# Patient Record
Sex: Female | Born: 1990 | Race: Black or African American | Hispanic: No | Marital: Married | Smoking: Former smoker
Health system: Southern US, Community
[De-identification: ages and names within clinical notes are randomized; demographics above are authoritative.]

## PROBLEM LIST (undated history)

## (undated) HISTORY — PX: TONSILLECTOMY: SUR1361

---

## 2011-04-07 ENCOUNTER — Emergency Department (HOSPITAL_COMMUNITY)
Admission: EM | Admit: 2011-04-07 | Discharge: 2011-04-07 | Disposition: A | Discharge status: Home / Self Care | Payer: BC Managed Care – HMO | Attending: Emergency Medicine | Admitting: Emergency Medicine

## 2011-04-07 ENCOUNTER — Encounter (HOSPITAL_COMMUNITY): Payer: Self-pay | Admitting: *Deleted

## 2011-04-07 DIAGNOSIS — R509 Fever, unspecified: Secondary | ICD-10-CM | POA: Insufficient documentation

## 2011-04-07 DIAGNOSIS — R6889 Other general symptoms and signs: Secondary | ICD-10-CM | POA: Insufficient documentation

## 2011-04-07 DIAGNOSIS — J3489 Other specified disorders of nose and nasal sinuses: Secondary | ICD-10-CM | POA: Insufficient documentation

## 2011-04-07 DIAGNOSIS — IMO0001 Reserved for inherently not codable concepts without codable children: Secondary | ICD-10-CM | POA: Insufficient documentation

## 2011-04-07 DIAGNOSIS — R05 Cough: Secondary | ICD-10-CM | POA: Insufficient documentation

## 2011-04-07 DIAGNOSIS — R599 Enlarged lymph nodes, unspecified: Secondary | ICD-10-CM | POA: Insufficient documentation

## 2011-04-07 DIAGNOSIS — R059 Cough, unspecified: Secondary | ICD-10-CM | POA: Insufficient documentation

## 2011-04-07 DIAGNOSIS — R070 Pain in throat: Secondary | ICD-10-CM | POA: Insufficient documentation

## 2011-04-07 DIAGNOSIS — J45909 Unspecified asthma, uncomplicated: Secondary | ICD-10-CM | POA: Insufficient documentation

## 2011-04-07 MED ORDER — ACETAMINOPHEN 325 MG PO TABS
650.0000 mg | ORAL_TABLET | Freq: Once | ORAL | Status: AC
  Administered 2011-04-07: 650 mg via ORAL
  Filled 2011-04-07: qty 2

## 2011-04-07 MED ORDER — OSELTAMIVIR PHOSPHATE 75 MG PO CAPS: 75.0000 mg | ORAL_CAPSULE | Freq: Two times a day (BID) | ORAL | Status: AC

## 2011-04-07 MED ORDER — DEXAMETHASONE SODIUM PHOSPHATE 10 MG/ML IJ SOLN
20.0000 mg | Freq: Once | INTRAMUSCULAR | Status: AC
  Administered 2011-04-07: 10 mg via INTRAMUSCULAR
  Filled 2011-04-07: qty 1

## 2011-04-07 MED ORDER — HYDROCODONE-HOMATROPINE 5-1.5 MG/5ML PO SYRP: 5.0000 mL | ORAL_SOLUTION | Freq: Four times a day (QID) | ORAL | Status: AC | PRN

## 2011-04-07 NOTE — ED Provider Notes (Signed)
Medical screening examination/treatment/procedure(s) were performed by non-physician practitioner and as supervising physician I was immediately available for consultation/collaboration.   Loren Racer, MD 04/07/11 [phone]

## 2011-04-07 NOTE — Discharge Instructions (Signed)
Take medications as prescribed. Followup with your doctor in regards to your hospital visit. If you do not have a doctor use the resource guide listed below to help he find one. You may return to the emergency department if symptoms worsen, become progressive, or become more concerning. Read below to learn more about your diagnosis & reasons to return. Use Tylenol to treat your fever  ° °Influenza, Adult  °Influenza (flu)  starts suddenly, usually with a fever. It causes chills, dry and hacking cough, headache, body aches, and sore throat. Influenza spreads easily from one person to another.  °HOME CARE  °Only take medicines as told by your doctor.  °Rest.  °Drink enough fluids to keep your pee (urine) clear or pale yellow.  °Wash your hands often. Do this after you blow your nose, after you go to the bathroom, and before you touch food.  °GET HELP RIGHT AWAY IF:  °You have shortness of breath while resting.  °You have pain or pressure in the chest or belly (abdomen).  °You suddenly feel dizzy.  °You feel confused.  °You have a hard time breathing.  °Your skin or nails turn bluish in color.  °You get a bad neck pain or stiffness.  °You get a bad headache, face pain, or earache.  °You throw up (vomit) a lot and often.  °You have a fever > 101 that persists  °MAKE SURE YOU:  °Understand these instructions.  °Will watch your condition.  °Will get help right away if you are not doing well or get worse.  ° ° °RESOURCE GUIDE ° °Dental Problems ° °Patients with Medicaid: °[facility]                     [facility] °[address]                                           [address] °Phone:  [phone]                                                  Phone:  510-2600 ° °If unable to pay or uninsured, contact:  [facility] or [facility] to become qualified for the adult dental clinic. ° °Chronic Pain Problems °Contact [facility]  [phone] °Patients  need to be referred by their primary care doctor. ° °Insufficient Money for Medicine °Contact [employer]:  call "211" or [facility] 271-5999. ° °No Primary Care Doctor °Call [facility]  832-8000 °Other agencies that provide inexpensive medical care °   [facility]  832-8035 °   [facility]  832-7272 °   [facility]  271-5999 °   [facility]  832-4777 °   [facility]  373-0678 °   [facility]  272-1050 ° °Psychological Services °[facility]  [phone] °[facility]  [phone] °[facility]   800 853-5163 (emergency services 641-4993) ° °Substance Abuse Resources °[facility]  336-882-2125 °[facility] 336-784-9470 °[facility] 336-285-9073 °[facility] 336-845-3988 °Residential & Outpatient Substance Abuse Program  800-659-3381 ° °Abuse/Neglect °[place] [facility] (336) 641-3795 °[place]   Child Abuse Hotline 800-378-5315 (After Hours) ° °Emergency Shelter °[facility] (336) 271-5985 ° °Maternity Homes °[facility] (336) 275-9566 °[facility] (704) 372-4663 ° °MRSA Hotline #:   832-7006 ° ° ° °[place] [facility] ° °[facility] [place] [facility]     [employer]                          [facility] °[address] [place]                       [address]      [address]  °[place]                                                [place]                            [place] °Phone:  349-3220                                   Phone:  [phone]                 Phone:  342-8140 ° °[facility] °Phone:  342-8316 ° °[place] Child Abuse Hotline °(336) 342-1394 °(336) 342-3537 (After Hours) ° ° °

## 2011-04-07 NOTE — ED Notes (Signed)
Pt only was given 10mg  of decadron per edpa verbal order

## 2011-04-07 NOTE — ED Notes (Signed)
Pt states she started to have a sore throat yesterday after work. Pt states she is having generalized body aches with ear ringing. Pt denies any dizziness or chest tightness. Pt states she has felt warm but is unsure if she had a temp.

## 2011-04-07 NOTE — ED Provider Notes (Signed)
History     CSN: [id number]  Arrival date & time 04/07/11  4098   First MD Initiated Contact with Patient 04/07/11 [phone]      Chief Complaint  Patient presents with  . Sore Throat  . Generalized Body Aches    (Consider location/radiation/quality/duration/timing/severity/associated sxs/prior treatment) HPI Comments: Pt presents to the ED with complaints of flu-like symptoms of cough, congestion, sore throat, muscle aches, chills, fevers. The patient states that the symptoms started yesterday.  Pt has not been around other sick contacts. Pt did not get the flu shot this year. The patient denies headaches, neck pain, weakness, vision changes, severe abdominal pain, inability to eat or drink, difficulty breathing, SOB, wheezing, chest pain. The patient has tried cough medicine, NSAIDS, and rest but has only felt mild relief.    Patient is a 21 y.o. female presenting with pharyngitis. The history is provided by the patient.  Sore Throat This is a new problem. The current episode started yesterday. The problem occurs constantly. The problem has been gradually worsening. Associated symptoms include chills, congestion, fatigue, a fever and myalgias. Pertinent negatives include no abdominal pain, anorexia, arthralgias, change in bowel habit, chest pain, coughing, diaphoresis, headaches, nausea, neck pain, numbness, rash, sore throat, swollen glands, urinary symptoms, vertigo, visual change, vomiting or weakness. The symptoms are aggravated by drinking, eating and swallowing. She has tried nothing for the symptoms.    Past Medical History  Diagnosis Date  . Asthma     Past Surgical History  Procedure Date  . Tonsillectomy     History reviewed. No pertinent family history.  History  Substance Use Topics  . Smoking status: Never Smoker   . Smokeless tobacco: Not on file  . Alcohol Use: Yes     occa    OB History    Grav Para Term Preterm Abortions TAB SAB Ect Mult Living        Review of Systems  Constitutional: Positive for fever, chills and fatigue. Negative for diaphoresis.  HENT: Positive for congestion. Negative for ear pain, sore throat, rhinorrhea, sneezing, neck pain, neck stiffness, sinus pressure and tinnitus.   Eyes: Negative for visual disturbance.  Respiratory: Negative for cough and chest tightness.   Cardiovascular: Negative for chest pain and palpitations.  Gastrointestinal: Negative for nausea, vomiting, abdominal pain, diarrhea, anorexia and change in bowel habit.  Genitourinary: Negative for dysuria.  Musculoskeletal: Positive for myalgias. Negative for arthralgias.  Skin: Negative for color change and rash.  Neurological: Negative for dizziness, vertigo, weakness, numbness and headaches.  Hematological: Does not bruise/bleed easily.  Psychiatric/Behavioral: Negative for confusion.  All other systems reviewed and are negative.    Allergies  Review of patient's allergies indicates no known allergies.  Home Medications  No current outpatient prescriptions on file.  BP 103/63  Pulse 106  Temp(Src) 99.2 F (37.3 C) (Oral)  Resp 22  Ht 5\' 1"  (1.549 m)  Wt 130 lb (58.968 kg)  BMI 24.56 kg/m2  SpO2 98%  Physical Exam  Nursing note and vitals reviewed. Constitutional: She is oriented to person, place, and time. She appears well-developed and well-nourished. No distress.       BP 103/63  Pulse 106  Temp(Src) 99.2 F (37.3 C) (Oral)  Resp 22  Ht 5\' 1"  (1.549 m)  Wt 130 lb (58.968 kg)  BMI 24.56 kg/m2  SpO2 98%   HENT:  Head: Normocephalic and atraumatic. No trismus in the jaw.  Right Ear: Tympanic membrane, external ear and ear canal normal.  No drainage or tenderness. No mastoid tenderness.  Left Ear: Tympanic membrane, external ear and ear canal normal. No drainage or tenderness. No mastoid tenderness.  Nose: Nose normal. No rhinorrhea or sinus tenderness. Right sinus exhibits no maxillary sinus tenderness and no frontal  sinus tenderness. Left sinus exhibits no maxillary sinus tenderness and no frontal sinus tenderness.  Mouth/Throat: Uvula is midline and mucous membranes are normal. Normal dentition. No dental abscesses or uvula swelling. Oropharyngeal exudate present. No posterior oropharyngeal edema, posterior oropharyngeal erythema or tonsillar abscesses.       Tonsillectomy. No trismus, tongue swelling. Soft palate non tender, no uvula swelling (midline).   Eyes: Conjunctivae and EOM are normal. Right eye exhibits no discharge. Left eye exhibits no discharge. No scleral icterus.  Neck: Trachea normal, normal range of motion and full passive range of motion without pain. Neck supple. No rigidity. Erythema present. Normal range of motion present. No Brudzinski's sign noted.       Negative Bolte sign  Cardiovascular: Normal rate, regular rhythm and normal heart sounds.   Pulmonary/Chest: Effort normal and breath sounds normal. No stridor. No respiratory distress. She has no wheezes. She exhibits no tenderness.  Abdominal: Soft. There is no tenderness.  Musculoskeletal: Normal range of motion.  Lymphadenopathy:       Head (right side): No submental, no submandibular, no tonsillar, no preauricular and no posterior auricular adenopathy present.       Head (left side): No submental, no submandibular, no tonsillar, no preauricular and no posterior auricular adenopathy present.    She has cervical adenopathy.  Neurological: She is alert and oriented to person, place, and time.  Skin: Skin is warm and dry. No rash noted. She is not diaphoretic.  Psychiatric: She has a normal mood and affect. Her behavior is normal.    ED Course  Procedures (including critical care time)   Labs Reviewed  RAPID STREP SCREEN   No results found.   No diagnosis found.  10 IM decadron given, strep neg.   MDM  Flu like s/s  Patient with symptoms consistent with influenza.  Vitals are stable, low-grade fever.  No signs of  dehydration, tolerating PO's.  Lungs are clear. Due to patient's presentation and physical exam a chest x-ray was not ordered bc likely diagnosis of flu.  Discussed the cost versus benefit of Tamiflu treatment with the patient. Patient will be discharged with instructions to orally hydrate, rest, and use over-the-counter medications such as anti-inflammatories ibuprofen and Aleve for muscle aches and Tylenol for fever.  Patient will also be given a cough suppressant and tamiflu.          Jaci Carrel, [place] 04/07/11 [phone]

## 2013-10-23 ENCOUNTER — Ambulatory Visit: Payer: BC Managed Care – HMO | Admitting: Family Medicine

## 2013-10-23 DIAGNOSIS — Z0289 Encounter for other administrative examinations: Secondary | ICD-10-CM

## 2015-07-28 ENCOUNTER — Emergency Department (HOSPITAL_COMMUNITY)
Admission: EM | Admit: 2015-07-28 | Discharge: 2015-07-28 | Discharge status: Against Medical Advice | Payer: Self-pay | Attending: Emergency Medicine | Admitting: Emergency Medicine

## 2015-07-28 ENCOUNTER — Encounter (HOSPITAL_COMMUNITY): Payer: Self-pay | Admitting: Emergency Medicine

## 2015-07-28 DIAGNOSIS — Z5321 Procedure and treatment not carried out due to patient leaving prior to being seen by health care provider: Secondary | ICD-10-CM | POA: Insufficient documentation

## 2015-07-28 DIAGNOSIS — J45909 Unspecified asthma, uncomplicated: Secondary | ICD-10-CM | POA: Insufficient documentation

## 2015-07-28 DIAGNOSIS — R1030 Lower abdominal pain, unspecified: Secondary | ICD-10-CM | POA: Insufficient documentation

## 2015-07-28 NOTE — ED Notes (Signed)
First attempt to call for the pt with no answer.

## 2015-07-28 NOTE — ED Notes (Addendum)
Pt states she is experiencing lower abdominal pain that she initially thought might be cramping. Pt now describes it as a constipated feeling, her last bowel movement was 2 days ago. Pt states she often suffers from constipation.  Pt denies emesis, diarrhea, or urinary symptoms

## 2016-05-03 ENCOUNTER — Ambulatory Visit (INDEPENDENT_AMBULATORY_CARE_PROVIDER_SITE_OTHER): Payer: BLUE CROSS/BLUE SHIELD | Admitting: Obstetrics and Gynecology

## 2016-05-03 ENCOUNTER — Encounter: Payer: Self-pay | Admitting: Obstetrics and Gynecology

## 2016-05-03 VITALS — BP 106/74 | HR 112 | Ht 62.0 in | Wt 177.0 lb

## 2016-05-03 DIAGNOSIS — Z30017 Encounter for initial prescription of implantable subdermal contraceptive: Secondary | ICD-10-CM

## 2016-05-03 DIAGNOSIS — Z01419 Encounter for gynecological examination (general) (routine) without abnormal findings: Secondary | ICD-10-CM

## 2016-05-03 DIAGNOSIS — Z0000 Encounter for general adult medical examination without abnormal findings: Secondary | ICD-10-CM

## 2016-05-03 DIAGNOSIS — Z3046 Encounter for surveillance of implantable subdermal contraceptive: Secondary | ICD-10-CM

## 2016-05-03 NOTE — Progress Notes (Signed)
Subjective:     Stephanie Woods is a 26 y.o. female with BMI 32 who is here for a comprehensive physical exam. The patient reports no problems. She has an expired Nexplanon and would like it removed and replaces. She is using condoms for contraception. She reports a monthly period of 3-4 days. She denies any pelvic pain or discharge. 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 She is also planning her wedding in the upcoming year. Patient is without any other complaints  Past Medical History:  Diagnosis Date  . Asthma    Past Surgical History:  Procedure Laterality Date  . TONSILLECTOMY     No family history on file.  Social History   Social History  . Marital status: Single    Spouse name: N/A  . Number of children: N/A  . Years of education: N/A   Occupational History  . Not on file.   Social History Main Topics  . Smoking status: Never Smoker  . Smokeless tobacco: Not on file  . Alcohol use Yes     Comment: occa  . Drug use: No  . Sexual activity: Yes    Birth control/ protection: Inserts   Other Topics Concern  . Not on file   Social History Narrative  . No narrative on file   Health Maintenance  Topic Date Due  . HIV Screening  12/13/2005  . TETANUS/TDAP  12/13/2009  . PAP SMEAR  12/14/2011  . INFLUENZA VACCINE  09/23/2015       Review of Systems Pertinent items are noted in HPI.   Objective:      GENERAL: Well-developed, well-nourished female in no acute distress.  HEENT: Normocephalic, atraumatic. Sclerae anicteric.  NECK: Supple. Normal thyroid.  LUNGS: Clear to auscultation bilaterally.  HEART: Regular rate and rhythm. BREASTS: Symmetric in size. No palpable masses or lymphadenopathy, skin changes, or nipple drainage. ABDOMEN: Soft, nontender, nondistended. No organomegaly. PELVIC: Normal external female genitalia. Vagina is pink and rugated.  Normal discharge. Normal appearing cervix. Uterus is normal in size. No adnexal mass or  tenderness. EXTREMITIES: No cyanosis, clubbing, or edema, 2+ distal pulses.    Assessment:    Healthy female exam.      Plan:    Pap smear collected Nexplanon was removed and inserted (see procedure note) Patient will be contacted with any abnormal results  Patient given informed consent, signed copy in the chart, time out was performed. Pregnancy test was negative. Removal and Insertion of Nexplanon Patient given informed consent for removal of her Nexplanon, time out was performed.  Signed copy in the chart.  Appropriate time out taken. Implanon site identified.  Area prepped in usual sterile fashon. One cc of 1% lidocaine was used to anesthetize the area at the distal end of the implant. A small stab incision was made right beside the implant on the distal portion.  The Nexplanon rod was grasped using hemostats and removed without difficulty.  There was less than 3 cc blood loss. There were no complications. Using the same incision site, the new Nexplanon was inserted. Nexplanon removed form packaging.  Device confirmed in needle, then inserted full length of needle and withdrawn per handbook instructions.  Patient insertion site covered with a small amount of antibiotic ointment and steri-strips were applied over the small incision.  A pressure bandage was applied to reduce any bruising.   Minimal blood loss.  Patient tolerated the procedure well and was given post procedure instructions.  See After Visit  Summary for Counseling Recommendations

## 2016-05-03 NOTE — Progress Notes (Signed)
Pt states that she is needing her nexplanon changed, was due to come out a year ago.  Pt works full time, in school and is planning her wedding.

## 2016-05-06 LAB — CYTOLOGY - PAP

## 2016-05-07 ENCOUNTER — Telehealth: Payer: Self-pay | Admitting: *Deleted

## 2016-05-07 NOTE — Telephone Encounter (Signed)
Attempted to call patient- call can not be completed at this time- please try again message.

## 2016-05-07 NOTE — Telephone Encounter (Signed)
-----   Message from Catalina AntiguaPeggy Constant, MD sent at 05/06/2016  6:33 PM EDT ----- Please inform patient of inadequate sampling for pap smear analysis. She needs to return for a pap smear or follow up with pcp  Thanks  Peggy

## 2016-05-12 NOTE — Telephone Encounter (Signed)
Patient notified

## 2016-05-19 ENCOUNTER — Ambulatory Visit: Payer: BLUE CROSS/BLUE SHIELD | Admitting: Obstetrics and Gynecology

## 2016-05-25 ENCOUNTER — Encounter: Payer: Self-pay | Admitting: Obstetrics and Gynecology

## 2016-05-25 ENCOUNTER — Ambulatory Visit (INDEPENDENT_AMBULATORY_CARE_PROVIDER_SITE_OTHER): Payer: BLUE CROSS/BLUE SHIELD | Admitting: Obstetrics and Gynecology

## 2016-05-25 VITALS — BP 114/79 | HR 89 | Temp 98.8°F | Wt 176.0 lb

## 2016-05-25 DIAGNOSIS — Z113 Encounter for screening for infections with a predominantly sexual mode of transmission: Secondary | ICD-10-CM | POA: Diagnosis not present

## 2016-05-25 DIAGNOSIS — Z01419 Encounter for gynecological examination (general) (routine) without abnormal findings: Secondary | ICD-10-CM

## 2016-05-25 DIAGNOSIS — Z124 Encounter for screening for malignant neoplasm of cervix: Secondary | ICD-10-CM

## 2016-05-25 NOTE — Progress Notes (Signed)
GYN Visit  Repeat pap smear due to insufficient cellularity Normal EGBUS, vaginal vault and cervix Pap smear obtained  f/u results.

## 2016-05-25 NOTE — Progress Notes (Signed)
Patient is in the office for repeat pap.

## 2016-05-27 LAB — CYTOLOGY - PAP
Chlamydia: NEGATIVE
Diagnosis: NEGATIVE
NEISSERIA GONORRHEA: NEGATIVE
Trichomonas: NEGATIVE

## 2017-05-23 DIAGNOSIS — J069 Acute upper respiratory infection, unspecified: Secondary | ICD-10-CM | POA: Diagnosis not present

## 2017-06-08 DIAGNOSIS — J020 Streptococcal pharyngitis: Secondary | ICD-10-CM | POA: Diagnosis not present

## 2017-11-07 ENCOUNTER — Encounter (HOSPITAL_COMMUNITY): Payer: Self-pay | Admitting: *Deleted

## 2017-11-07 ENCOUNTER — Emergency Department (HOSPITAL_COMMUNITY)
Admission: EM | Admit: 2017-11-07 | Discharge: 2017-11-07 | Disposition: A | Discharge status: Home / Self Care | Payer: BLUE CROSS/BLUE SHIELD | Attending: Emergency Medicine | Admitting: Emergency Medicine

## 2017-11-07 DIAGNOSIS — T23231A Burn of second degree of multiple right fingers (nail), not including thumb, initial encounter: Secondary | ICD-10-CM | POA: Insufficient documentation

## 2017-11-07 DIAGNOSIS — T23001A Burn of unspecified degree of right hand, unspecified site, initial encounter: Secondary | ICD-10-CM | POA: Diagnosis present

## 2017-11-07 DIAGNOSIS — J45909 Unspecified asthma, uncomplicated: Secondary | ICD-10-CM | POA: Diagnosis not present

## 2017-11-07 DIAGNOSIS — Y999 Unspecified external cause status: Secondary | ICD-10-CM | POA: Insufficient documentation

## 2017-11-07 DIAGNOSIS — Y92000 Kitchen of unspecified non-institutional (private) residence as  the place of occurrence of the external cause: Secondary | ICD-10-CM | POA: Diagnosis not present

## 2017-11-07 DIAGNOSIS — Y93G3 Activity, cooking and baking: Secondary | ICD-10-CM | POA: Insufficient documentation

## 2017-11-07 DIAGNOSIS — T3150 Burns involving 50-59% of body surface with 0% to 9% third degree burns: Secondary | ICD-10-CM | POA: Diagnosis not present

## 2017-11-07 DIAGNOSIS — T23201A Burn of second degree of right hand, unspecified site, initial encounter: Secondary | ICD-10-CM

## 2017-11-07 DIAGNOSIS — X18XXXA Contact with other hot metals, initial encounter: Secondary | ICD-10-CM | POA: Insufficient documentation

## 2017-11-07 DIAGNOSIS — T23262A Burn of second degree of back of left hand, initial encounter: Secondary | ICD-10-CM | POA: Diagnosis not present

## 2017-11-07 MED ORDER — SILVER SULFADIAZINE 1 % EX CREA
TOPICAL_CREAM | Freq: Once | CUTANEOUS | Status: AC
Start: 2017-11-07 — End: 2017-11-07
  Administered 2017-11-07: 1 via TOPICAL
  Filled 2017-11-07: qty 85

## 2017-11-07 MED ORDER — OXYCODONE-ACETAMINOPHEN 5-325 MG PO TABS
1.0000 | ORAL_TABLET | ORAL | 0 refills | Status: DC | PRN
Start: 2017-11-07 — End: 2018-01-17
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
## 2017-11-07 NOTE — Discharge Instructions (Addendum)
Thank you for allowing me to care for you today in the Emergency Department.   Please call the [facility] today to set up an appointment for their next clinic on Monday.  To care for your wound at home, keep the hand clean with soap and water.  Apply Silvadene cream up to 2 times daily.  You can then wrap the area with a gauze dressing and an Ace bandage.  Make sure to change the dressing at least one time daily.  Return to the emergency department if you develop redness, worsening pain, or swelling that extends up the arm, fever, chills, or other new, concerning symptoms.

## 2017-11-07 NOTE — ED Notes (Signed)
ED Provider at bedside. 

## 2017-11-07 NOTE — ED Notes (Signed)
Pt verbalized understanding of discharge instructions and denies any further questions at this time.   

## 2017-11-14 DIAGNOSIS — T300 Burn of unspecified body region, unspecified degree: Secondary | ICD-10-CM | POA: Diagnosis not present

## 2017-11-28 DIAGNOSIS — T300 Burn of unspecified body region, unspecified degree: Secondary | ICD-10-CM | POA: Diagnosis not present

## 2018-01-17 ENCOUNTER — Encounter: Payer: Self-pay | Admitting: Family Medicine

## 2018-01-17 ENCOUNTER — Ambulatory Visit (INDEPENDENT_AMBULATORY_CARE_PROVIDER_SITE_OTHER): Payer: BLUE CROSS/BLUE SHIELD | Admitting: Family Medicine

## 2018-01-17 VITALS — BP 113/77 | HR 94 | Wt 181.0 lb

## 2018-01-17 DIAGNOSIS — Z3049 Encounter for surveillance of other contraceptives: Secondary | ICD-10-CM | POA: Diagnosis not present

## 2018-01-17 DIAGNOSIS — Z3046 Encounter for surveillance of implantable subdermal contraceptive: Secondary | ICD-10-CM

## 2018-01-17 DIAGNOSIS — Z3169 Encounter for other general counseling and advice on procreation: Secondary | ICD-10-CM

## 2018-01-17 MED ORDER — CONCEPT OB 130-92.4-1 MG PO CAPS: 1.0000 | ORAL_CAPSULE | Freq: Every day | ORAL | 11 refills | Status: DC

## 2018-01-17 NOTE — Progress Notes (Signed)
   Subjective:    Patient ID: Molly MaduroCortney Mcnairy is a 27 y.o. female presenting with Contraception  on 01/17/2018  HPI: Here today for Nexplanon removal. Reports painful cycles and mood swings. In x 1.5 years. But has had 3-4 and they worked well for her. Does not desire further contraceptions. Wants to get pregnant soon. She is nervous about pregnancy and delivery due to her knowledge about increasing maternal death rates and racial disparities.  Review of Systems  Constitutional: Negative for chills and fever.  Respiratory: Negative for shortness of breath.   Cardiovascular: Negative for chest pain.  Gastrointestinal: Negative for abdominal pain, nausea and vomiting.  Genitourinary: Negative for dysuria.  Skin: Negative for rash.      Objective:    BP 113/77   Pulse 94   Wt 181 lb (82.1 kg)   LMP  (Approximate) Comment: 11/2017 spotting   BMI 33.11 kg/m  Physical Exam  Constitutional: She is oriented to person, place, and time. She appears well-developed and well-nourished. No distress.  HENT:  Head: Normocephalic and atraumatic.  Eyes: No scleral icterus.  Neck: Neck supple.  Cardiovascular: Normal rate.  Pulmonary/Chest: Effort normal.  Abdominal: Soft.  Neurological: She is alert and oriented to person, place, and time.  Skin: Skin is warm and dry.  Psychiatric: She has a normal mood and affect.   Procedure: Patient given informed consent for removal of her Nexplanon, time out was performed.  Signed copy in the chart.  Appropriate time out taken. Nexplanon site identified.  Area prepped in usual sterile fashon. Two cc of 1% lidocaine was used to anesthetize the area at the distal end of the implant. A small stab incision was made right beside the implant on the distal portion.  The Nexplanon rod was grasped using hemostats and removed without difficulty.  There was less than 3 cc blood loss. There were no complications.  A small amount of antibiotic ointment and steri-strips  were applied over the small incision.  A pressure bandage was applied to reduce any bruising.  The patient tolerated the procedure well and was given post procedure instructions.      Assessment & Plan:  Encounter for preconception consultation - Discussed healthy lifestyle, begin PNV's. Discussed commone reasons for maternal death and what our practice is doing to try to improve this data. - Plan: Prenat w/o A Vit-[id number]-FePo-[id number] (CONCEPT OB) 130-92.4-1 MG CAPS  Nexplanon removal   Total face-to-face time with patient: 10 minutes. Over 50% of encounter was spent on counseling and coordination of care. Return if symptoms worsen or fail to improve.  Reva Boresanya S Shericka Johnstone 01/17/2018 9:00 AM

## 2018-01-17 NOTE — Progress Notes (Signed)
RGYN pt presents for Nexplanon Removal.  Inserted : 05/03/2016. In left arm by Dr.Constant. (expired nexplanon was removed new one was reinserted)  Pt c/o mood changes and if she does have a period it is painful.   LMP: 11/2017 per pt uncertain of exact date.   Pt does not wish to continue any contraception at this time.   AEX scheduled 01/30/18

## 2018-01-17 NOTE — Patient Instructions (Signed)
Preparing for Pregnancy If you are considering becoming pregnant, make an appointment to see your regular health care provider to learn how to prepare for a safe and healthy pregnancy (preconception care). During a preconception care visit, your health care provider will:  Do a complete physical exam, including a Pap test.  Take a complete medical history.  Give you information, answer your questions, and help you resolve problems.  Preconception checklist Medical history  Tell your health care provider about any current or past medical conditions. Your pregnancy or your ability to become pregnant may be affected by chronic conditions, such as diabetes, chronic hypertension, and thyroid problems.  Include your family's medical history as well as your partner's medical history.  Tell your health care provider about any history of STIs (sexually transmitted infections).These can affect your pregnancy. In some cases, they can be passed to your baby. Discuss any concerns that you have about STIs.  If indicated, discuss the benefits of genetic testing. This testing will show whether there are any genetic conditions that may be passed from you or your partner to your baby.  Tell your health care provider about: ? Any problems you have had with conception or pregnancy. ? Any medicines you take. These include vitamins, herbal supplements, and over-the-counter medicines. ? Your history of immunizations. Discuss any vaccinations that you may need.  Diet  Ask your health care provider what to include in a healthy diet that has a balance of nutrients. This is especially important when you are pregnant or preparing to become pregnant.  Ask your health care provider to help you reach a healthy weight before pregnancy. ? If you are overweight, you may be at higher risk for certain complications, such as high blood pressure, diabetes, and preterm birth. ? If you are underweight, you are more likely  to have a baby who has a low birth weight.  Lifestyle, work, and home  Let your health care provider know: ? About any lifestyle habits that you have, such as alcohol use, drug use, or smoking. ? About recreational activities that may put you at risk during pregnancy, such as downhill skiing and certain exercise programs. ? Tell your health care provider about any international travel, especially any travel to places with an active Zika virus outbreak. ? About harmful substances that you may be exposed to at work or at home. These include chemicals, pesticides, radiation, or even litter boxes. ? If you do not feel safe at home.  Mental health  Tell your health care provider about: ? Any history of mental health conditions, including feelings of depression, sadness, or anxiety. ? Any medicines that you take for a mental health condition. These include herbs and supplements.  Home instructions to prepare for pregnancy Lifestyle  Eat a balanced diet. This includes fresh fruits and vegetables, whole grains, lean meats, low-fat dairy products, healthy fats, and foods that are high in fiber. Ask to meet with a nutritionist or registered dietitian for assistance with meal planning and goals.  Get regular exercise. Try to be active for at least 30 minutes a day on most days of the week. Ask your health care provider which activities are safe during pregnancy.  Do not use any products that contain nicotine or tobacco, such as cigarettes and e-cigarettes. If you need help quitting, ask your health care provider.  Do not drink alcohol.  Do not take illegal drugs.  Maintain a healthy weight. Ask your health care provider what weight range is   right for you.  General instructions  Keep an accurate record of your menstrual periods. This makes it easier for your health care provider to determine your baby's due date.  Begin taking prenatal vitamins and folic acid supplements daily as directed by  your health care provider.  Manage any chronic conditions, such as high blood pressure and diabetes, as told by your health care provider. This is important.  How do I know that I am pregnant? You may be pregnant if you have been sexually active and you miss your period. Symptoms of early pregnancy include:  Mild cramping.  Very light vaginal bleeding (spotting).  Feeling unusually tired.  Nausea and vomiting (morning sickness).  If you have any of these symptoms and you suspect that you might be pregnant, you can take a home pregnancy test. These tests check for a hormone in your urine (human chorionic gonadotropin, or hCG). A woman's body begins to make this hormone during early pregnancy. These tests are very accurate. Wait until at least the first day after you miss your period to take one. If the test shows that you are pregnant (you get a positive result), call your health care provider to make an appointment for prenatal care. What should I do if I become pregnant?  Make an appointment with your health care provider as soon as you suspect you are pregnant.  Do not use any products that contain nicotine, such as cigarettes, chewing tobacco, and e-cigarettes. If you need help quitting, ask your health care provider.  Do not drink alcoholic beverages. Alcohol is related to a number of birth defects.  Avoid toxic odors and chemicals.  You may continue to have sexual intercourse if it does not cause pain or other problems, such as vaginal bleeding. This information is not intended to replace advice given to you by your health care provider. Make sure you discuss any questions you have with your health care provider. Document Released: 01/22/2008 Document Revised: 10/07/2015 Document Reviewed: 08/31/2015 Elsevier Interactive Patient Education  2018 [employer] Inc[employer]  

## 2018-01-30 ENCOUNTER — Ambulatory Visit (INDEPENDENT_AMBULATORY_CARE_PROVIDER_SITE_OTHER): Payer: BLUE CROSS/BLUE SHIELD | Admitting: Obstetrics and Gynecology

## 2018-01-30 ENCOUNTER — Encounter: Payer: Self-pay | Admitting: Obstetrics and Gynecology

## 2018-01-30 VITALS — BP 118/74 | HR 95 | Wt 182.0 lb

## 2018-01-30 DIAGNOSIS — Z124 Encounter for screening for malignant neoplasm of cervix: Secondary | ICD-10-CM | POA: Diagnosis not present

## 2018-01-30 DIAGNOSIS — Z01419 Encounter for gynecological examination (general) (routine) without abnormal findings: Secondary | ICD-10-CM | POA: Diagnosis not present

## 2018-01-30 DIAGNOSIS — Z113 Encounter for screening for infections with a predominantly sexual mode of transmission: Secondary | ICD-10-CM

## 2018-01-30 MED ORDER — NORGESTIMATE-ETH ESTRADIOL 0.25-35 MG-MCG PO TABS: 1.0000 | ORAL_TABLET | Freq: Every day | ORAL | 4 refills | Status: DC

## 2018-01-30 NOTE — Progress Notes (Signed)
Patient presents for Annual Exam today.  CC: NONE    LMP:01/19/18 Last pap:05/25/2017 WNL  Contraception: None Nexplanon was recently  removed  STD Screening: Full panel

## 2018-01-30 NOTE — Progress Notes (Signed)
Subjective:     Stephanie Woods is a 27 y.o. female G1P0 with BMI 33 and LMP 01/19/2018 who is here for a comprehensive physical exam. The patient reports no problems. She is sexually active without contraception. She is considering conception but would like to delay for a few more months. Patient is interested in COC for contraception. She reports a 5 day period for the first time following Nexplanon removal. She denies any pelvic pain or abnormal discharge  Past Medical History:  Diagnosis Date  . Asthma    Past Surgical History:  Procedure Laterality Date  . TONSILLECTOMY     Family History  Problem Relation Age of Onset  . Hypertension Maternal Grandmother   . Hypertension Mother     Social History   Socioeconomic History  . Marital status: Single    Spouse name: Not on file  . Number of children: Not on file  . Years of education: Not on file  . Highest education level: Not on file  Occupational History  . Not on file  Social Needs  . Financial resource strain: Not on file  . Food insecurity:    Worry: Not on file    Inability: Not on file  . Transportation needs:    Medical: Not on file    Non-medical: Not on file  Tobacco Use  . Smoking status: Current Every Day Smoker  . Smokeless tobacco: Never Used  Substance and Sexual Activity  . Alcohol use: Yes    Comment: social  . Drug use: No  . Sexual activity: Yes    Partners: Male    Birth control/protection: None  Lifestyle  . Physical activity:    Days per week: Not on file    Minutes per session: Not on file  . Stress: Not on file  Relationships  . Social connections:    Talks on phone: Not on file    Gets together: Not on file    Attends religious service: Not on file    Active member of club or organization: Not on file    Attends meetings of clubs or organizations: Not on file    Relationship status: Not on file  . Intimate partner violence:    Fear of current or ex partner: Not on file   Emotionally abused: Not on file    Physically abused: Not on file    Forced sexual activity: Not on file  Other Topics Concern  . Not on file  Social History Narrative  . Not on file   Health Maintenance  Topic Date Due  . HIV Screening  12/13/2005  . TETANUS/TDAP  12/13/2009  . PAP SMEAR  05/26/2019  . INFLUENZA VACCINE  Completed    Review of Systems Pertinent items are noted in HPI.   Objective:  Blood pressure 118/74, pulse 95, weight 182 lb (82.6 kg), last menstrual period 01/19/2018.     GENERAL: Well-developed, well-nourished female in no acute distress.  HEENT: Normocephalic, atraumatic. Sclerae anicteric.  NECK: Supple. Normal thyroid.  LUNGS: Clear to auscultation bilaterally.  HEART: Regular rate and rhythm. BREASTS: Symmetric in size. No palpable masses or lymphadenopathy, skin changes, or nipple drainage. ABDOMEN: Soft, nontender, nondistended. No organomegaly. PELVIC: Normal external female genitalia. Vagina is pink and rugated.  Normal discharge. Normal appearing cervix. Uterus is normal in size. No adnexal mass or tenderness. EXTREMITIES: No cyanosis, clubbing, or edema, 2+ distal pulses.    Assessment:    Healthy female exam.      Plan:  pap smear collected STI screen collected per patient request TSH and A1C today Rx sprintec provided Patient advised to start prenatal vitamins and healthy lifestyle in preparation for pregnancy Patient will be contacted with abnormal results See After Visit Summary for Counseling Recommendations

## 2018-01-31 LAB — HEPATITIS C ANTIBODY: Hep C Virus Ab: <0.1 s/co ratio (ref 0.0–0.9)

## 2018-01-31 LAB — RPR: RPR: NONREACTIVE

## 2018-01-31 LAB — HEMOGLOBIN A1C
Est. average glucose Bld gHb Est-mCnc: 100 mg/dL
Hgb A1c MFr Bld: 5.1 % (ref 4.8–5.6)

## 2018-01-31 LAB — HEPATITIS B SURFACE ANTIGEN: HEP B S AG: NEGATIVE

## 2018-01-31 LAB — TSH: TSH: 0.965 u[IU]/mL (ref 0.450–4.500)

## 2018-01-31 LAB — HIV ANTIBODY (ROUTINE TESTING W REFLEX): HIV SCREEN 4TH GENERATION: NONREACTIVE

## 2018-02-01 LAB — CYTOLOGY - PAP: Diagnosis: NEGATIVE

## 2018-02-16 DIAGNOSIS — J029 Acute pharyngitis, unspecified: Secondary | ICD-10-CM | POA: Diagnosis not present

## 2018-02-22 NOTE — L&D Delivery Note (Signed)
LABOR COURSE Patient was admitted for IOL for FGR (~5%). She received Cytotec, a foley balloon and was augmented with Pitocin. She SROM'd this morning at 0400.  Delivery Note At bedside for final 60 minutes of pushing. Head delivered LOT . Very loose nuchal, delivered through. Shoulder and body delivered in usual fashion. At [phone] a viable female was delivered via Vaginal, Spontaneous (Presentation: LOT; LOA).  Infant with poor rigor, color, absent spontaneous cry after 30 seconds of tactile stimulation and use of bulb suction. Code APGAR initiated. Cord clamped x 2, cut after 30 second delay, and cut by CNM. Newborn relocated to warmer. Cord blood drawn. Placenta delivered spontaneously with gentle cord traction. Appears intact. Fundus firm with massage and Pitocin. 1000 mcg Cytotec placed per rectum for brisk bleeding immediately after delivery. Labia, perineum, vagina, and cervix inspected with laceration as below.   APGAR:2 ,4, 9; weight: 2980g     Cord: 3VC with the following complications:N/A.   Cord pH: Not collected, not requested by Neonatologist  Anesthesia:  Epidural plus local lidocaine for perineal repair Episiotomy: None Lacerations: 2nd degree perineal Suture Repair: 3.0 vicryl Q Blood Loss (mL): 186  Mom to postpartum.  Baby to Couplet care / Skin to Skin.  Message sent to clinic for coordination of postpartum appointment  Mallie Snooks, CNM 12/25/18 11:45 AM

## 2018-03-21 DIAGNOSIS — J029 Acute pharyngitis, unspecified: Secondary | ICD-10-CM | POA: Diagnosis not present

## 2018-03-21 DIAGNOSIS — R6889 Other general symptoms and signs: Secondary | ICD-10-CM | POA: Diagnosis not present

## 2018-04-13 ENCOUNTER — Encounter: Payer: Self-pay | Admitting: Internal Medicine

## 2018-04-13 ENCOUNTER — Ambulatory Visit (INDEPENDENT_AMBULATORY_CARE_PROVIDER_SITE_OTHER): Payer: BLUE CROSS/BLUE SHIELD | Admitting: Internal Medicine

## 2018-04-13 VITALS — BP 112/80 | HR 90 | Temp 99.0°F | Ht 62.2 in | Wt 177.2 lb

## 2018-04-13 DIAGNOSIS — L308 Other specified dermatitis: Secondary | ICD-10-CM

## 2018-04-13 DIAGNOSIS — G4709 Other insomnia: Secondary | ICD-10-CM | POA: Insufficient documentation

## 2018-04-13 DIAGNOSIS — F17210 Nicotine dependence, cigarettes, uncomplicated: Secondary | ICD-10-CM

## 2018-04-13 DIAGNOSIS — R5383 Other fatigue: Secondary | ICD-10-CM

## 2018-04-13 DIAGNOSIS — Z6832 Body mass index (BMI) 32.0-32.9, adult: Secondary | ICD-10-CM

## 2018-04-13 DIAGNOSIS — R635 Abnormal weight gain: Secondary | ICD-10-CM | POA: Diagnosis not present

## 2018-04-13 MED ORDER — VARENICLINE TARTRATE 0.5 MG X 11 & 1 MG X 42 PO MISC: ORAL | 0 refills | Status: DC

## 2018-04-13 MED ORDER — TRIAMCINOLONE ACETONIDE 0.1 % EX CREA: 1.0000 "application " | TOPICAL_CREAM | Freq: Two times a day (BID) | CUTANEOUS | 0 refills | Status: DC

## 2018-04-13 NOTE — Patient Instructions (Signed)

## 2018-04-14 LAB — LIPID PANEL
CHOL/HDL RATIO: 5.3 ratio — AB (ref 0.0–4.4)
Cholesterol, Total: 212 mg/dL — ABNORMAL HIGH (ref 100–199)
HDL: 40 mg/dL (ref >39)
LDL CALC: 103 mg/dL — AB (ref 0–99)
Triglycerides: 346 mg/dL — ABNORMAL HIGH (ref 0–149)
VLDL Cholesterol Cal: 69 mg/dL — ABNORMAL HIGH (ref 5–40)

## 2018-04-14 LAB — CBC
HEMOGLOBIN: 12.5 g/dL (ref 11.1–15.9)
Hematocrit: 36.9 % (ref 34.0–46.6)
MCH: 27.7 pg (ref 26.6–33.0)
MCHC: 33.9 g/dL (ref 31.5–35.7)
MCV: 82 fL (ref 79–97)
Platelets: 287 10*3/uL (ref 150–450)
RBC: 4.52 x10E6/uL (ref 3.77–5.28)
RDW: 12.8 % (ref 11.7–15.4)
WBC: 6.3 10*3/uL (ref 3.4–10.8)

## 2018-04-14 LAB — CMP14+EGFR
A/G RATIO: 1.5 (ref 1.2–2.2)
ALBUMIN: 4.6 g/dL (ref 3.9–5.0)
ALT: 13 IU/L (ref 0–32)
AST: 19 IU/L (ref 0–40)
Alkaline Phosphatase: 67 IU/L (ref 39–117)
BUN/Creatinine Ratio: 14 (ref 9–23)
BUN: 9 mg/dL (ref 6–20)
Bilirubin Total: 0.3 mg/dL (ref 0.0–1.2)
CO2: 19 mmol/L — ABNORMAL LOW (ref 20–29)
CREATININE: 0.66 mg/dL (ref 0.57–1.00)
Calcium: 9.8 mg/dL (ref 8.7–10.2)
Chloride: 102 mmol/L (ref 96–106)
GFR, EST AFRICAN AMERICAN: 140 mL/min/{1.73_m2} (ref >59)
GFR, EST NON AFRICAN AMERICAN: 121 mL/min/{1.73_m2} (ref >59)
GLOBULIN, TOTAL: 3.1 g/dL (ref 1.5–4.5)
Glucose: 77 mg/dL (ref 65–99)
Potassium: 4.6 mmol/L (ref 3.5–5.2)
Sodium: 139 mmol/L (ref 134–144)
TOTAL PROTEIN: 7.7 g/dL (ref 6.0–8.5)

## 2018-04-16 ENCOUNTER — Encounter: Payer: Self-pay | Admitting: Internal Medicine

## 2018-04-16 DIAGNOSIS — F17210 Nicotine dependence, cigarettes, uncomplicated: Secondary | ICD-10-CM | POA: Insufficient documentation

## 2018-04-16 NOTE — Progress Notes (Signed)
Subjective:     Patient ID: Stephanie Woods , female    DOB: September 16, 1990 , 28 y.o.   MRN: [id number]   Chief Complaint  Patient presents with  . Establish Care  . Rash    HPI  She is here today to establish primary care.  She is transferring her care from [facility].  She has her pap smears performed by Dr. Mora Bellman. Her last visit was Nov 2019. She reports her pap smear was normal. She moved here from [place], [place]. She currently works as a Radio broadcast assistant.    She is most concerned about a rash.   Rash  This is a recurrent problem. The current episode started 1 to 4 weeks ago. The problem has been gradually worsening since onset. The affected locations include the chest and torso. The rash is characterized by dryness, itchiness and scaling. She was exposed to nothing. Associated symptoms include fatigue. Fever: She c/o weight gain. Past treatments include nothing.     Past Medical History:  Diagnosis Date  . Asthma      Family History  Problem Relation Age of Onset  . Hypertension Maternal Grandmother   . Hypertension Mother   . Healthy Father      Current Outpatient Medications:  .  triamcinolone cream (KENALOG) 0.1 %, Apply 1 application topically 2 (two) times daily., Disp: 30 g, Rfl: 0 .  varenicline (CHANTIX STARTING MONTH PAK) 0.5 MG X 11 & 1 MG X 42 tablet, Take one 0.5 mg tablet by mouth once daily for 3 days, then increase to one 0.5 mg tablet twice daily for 4 days, then increase to one 1 mg tablet twice daily., Disp: 53 tablet, Rfl: 0   No Known Allergies    Patient's last menstrual period was 04/01/2018..  Negative for: breast discharge, breast lump(s), breast pain and breast self exam. Associated symptoms include abnormal vaginal bleeding. Pertinent negatives include abnormal bleeding (hematology), anxiety, decreased libido, depression, difficulty falling sleep, dyspareunia, history of infertility, nocturia, sexual dysfunction, sleep disturbances,  urinary incontinence, urinary urgency, vaginal discharge and vaginal itching. Diet regular.The patient states her exercise level is   minimal.   . The patient's tobacco use is:  Social History   Tobacco Use  Smoking Status Current Every Day Smoker  . Packs/day: 0.25  . Years: 9.00  . Pack years: 2.25  . Types: Cigarettes  Smokeless Tobacco Never Used  . She has been exposed to passive smoke. The patient's alcohol use is:  Social History   Substance and Sexual Activity  Alcohol Use Yes   Comment: social    Review of Systems  Constitutional: Positive for fatigue and unexpected weight change. Fever: She c/o weight gain.  HENT: Negative.   Eyes: Negative.   Respiratory: Negative.   Cardiovascular: Negative.   Gastrointestinal: Negative.   Endocrine: Negative.   Genitourinary: Negative.   Skin: Positive for rash.  Allergic/Immunologic: Negative.   Neurological: Negative.   Hematological: Negative.   Psychiatric/Behavioral: Positive for sleep disturbance.     Today's Vitals   04/13/18 1128  BP: 112/80  Pulse: 90  Temp: 99 F (37.2 C)  TempSrc: Oral  Weight: 177 lb 3.2 oz (80.4 kg)  Height: 5' 2.2" (1.58 m)   Body mass index is 32.2 kg/m.   Objective:  Physical Exam Vitals signs and nursing note reviewed.  Constitutional:      Appearance: Normal appearance.  HENT:     Head: Normocephalic and atraumatic.  Cardiovascular:  Rate and Rhythm: Normal rate and regular rhythm.     Heart sounds: Normal heart sounds.  Pulmonary:     Effort: Pulmonary effort is normal.     Breath sounds: Normal breath sounds.  Skin:    General: Skin is warm.     Findings: Rash present.     Comments: Scattered areas of hyperpigmented lesions, scaly. No overlying erythema.   Neurological:     General: No focal deficit present.     Mental Status: She is alert.  Psychiatric:        Mood and Affect: Mood normal.        Behavior: Behavior normal.         Assessment And Plan:      1. Other eczema  She was given rx triamcinolone cream to apply to affected area twice daily as needed.  I will also refer her to Derm for further evaluation.   - Ambulatory referral to Dermatology  2. Other insomnia  She is encouraged to start magnesium 250-12m nightly. She will touch base with me in a week or two to let me know how this works for her.  If needed, I will consider another medication.   3. Fatigue, unspecified type  I will check labs as listed below. I will make further recommendations once her labs are reviewed. She is encouraged to stay well hydrated and to cut back on her intake of refined carbs.   - CBC no Diff - CMP14+EGFR  4. Weight gain  She thinks she has gained 30 pounds in the past three years. She is encouraged to avoid sugary beverages and processed foods.  Importance of regular exercise was also discussed with the patient.   - Lipid Profile  5. Cigarette nicotine dependence without complication  She has smoked 1ppd in the past. She started at age 28 she is now down to 4 cigarettes per day. We discussed the health risks associated with long-term tobacco use. She is willing to try Chantix. Possible side effects were discussed with the patient. A rx for the starter pack was sent to the pharmacy. She will rto in six weeks for re-evaluation.    6. Adult BMI 32.0-32.9 kg/sq m  Importance of achieving optimal weight to decrease risk of cardiovascular disease and cancers was discussed with the patient in full detail. She is encouraged to start slowly - start with 10 minutes twice daily at least three to four days per week and to gradually build to 30 minutes five days weekly. She was given tips to incorporate more activity into her daily routine - take stairs when possible, park farther away from her job, grocery stores, etc.   RMaximino Greenland MD

## 2018-04-30 ENCOUNTER — Other Ambulatory Visit: Payer: Self-pay | Admitting: Internal Medicine

## 2018-05-01 MED ORDER — TRIAMCINOLONE ACETONIDE 0.1 % EX CREA: 1.0000 "application " | TOPICAL_CREAM | Freq: Two times a day (BID) | CUTANEOUS | 0 refills | Status: DC

## 2018-05-09 ENCOUNTER — Other Ambulatory Visit: Payer: Self-pay

## 2018-05-09 ENCOUNTER — Ambulatory Visit (INDEPENDENT_AMBULATORY_CARE_PROVIDER_SITE_OTHER): Payer: BLUE CROSS/BLUE SHIELD

## 2018-05-09 DIAGNOSIS — N912 Amenorrhea, unspecified: Secondary | ICD-10-CM

## 2018-05-09 DIAGNOSIS — Z3201 Encounter for pregnancy test, result positive: Secondary | ICD-10-CM

## 2018-05-09 LAB — POCT URINE PREGNANCY: Preg Test, Ur: POSITIVE — AB

## 2018-05-09 NOTE — Progress Notes (Signed)
Stephanie Woods presents today for UPT. She has no unusual complaints and complains of round ligament pain. Pt states PNV's causes N&V.   LMP: 04/01/18    OBJECTIVE: Appears well, in no apparent distress.  OB History    Gravida  2   Para      Term      Preterm      AB  1   Living        SAB      TAB  1   Ectopic      Multiple      Live Births             Home UPT Result: Positive  In-Office UPT result: Positive  I have reviewed the patient's medical, obstetrical, social, and family histories, and medications.   ASSESSMENT: Positive pregnancy test  PLAN Prenatal care to be completed at:  [facility]  Try taking PNV's at bedtime.

## 2018-05-10 NOTE — Progress Notes (Signed)
Agree with A & P. 

## 2018-05-25 ENCOUNTER — Ambulatory Visit: Payer: BLUE CROSS/BLUE SHIELD | Admitting: Internal Medicine

## 2018-05-30 DIAGNOSIS — L2084 Intrinsic (allergic) eczema: Secondary | ICD-10-CM | POA: Diagnosis not present

## 2018-06-12 ENCOUNTER — Encounter: Payer: Self-pay | Admitting: Obstetrics and Gynecology

## 2018-06-12 ENCOUNTER — Other Ambulatory Visit: Payer: Self-pay

## 2018-06-12 ENCOUNTER — Ambulatory Visit (INDEPENDENT_AMBULATORY_CARE_PROVIDER_SITE_OTHER): Payer: BLUE CROSS/BLUE SHIELD | Admitting: Obstetrics and Gynecology

## 2018-06-12 DIAGNOSIS — Z113 Encounter for screening for infections with a predominantly sexual mode of transmission: Secondary | ICD-10-CM | POA: Diagnosis not present

## 2018-06-12 DIAGNOSIS — Z3481 Encounter for supervision of other normal pregnancy, first trimester: Secondary | ICD-10-CM | POA: Diagnosis not present

## 2018-06-12 DIAGNOSIS — Z3401 Encounter for supervision of normal first pregnancy, first trimester: Secondary | ICD-10-CM

## 2018-06-12 DIAGNOSIS — Z3400 Encounter for supervision of normal first pregnancy, unspecified trimester: Secondary | ICD-10-CM | POA: Insufficient documentation

## 2018-06-12 MED ORDER — VITAFOL GUMMIES 3.33-0.333-34.8 MG PO CHEW: 2.0000 | CHEWABLE_TABLET | Freq: Every day | ORAL | 6 refills | Status: AC

## 2018-06-12 NOTE — Patient Instructions (Addendum)
First Trimester of Pregnancy The first trimester of pregnancy is from week 1 until the end of week 13 (months 1 through 3). A week after a sperm fertilizes an egg, the egg will implant on the wall of the uterus. This embryo will begin to develop into a baby. Genes from you and your partner will form the baby. The female genes will determine whether the baby will be a boy or a girl. At 6-8 weeks, the eyes and face will be formed, and the heartbeat can be seen on ultrasound. At the end of 12 weeks, all the baby's organs will be formed. Now that you are pregnant, you will want to do everything you can to have a healthy baby. Two of the most important things are to get good prenatal care and to follow your health care provider's instructions. Prenatal care is all the medical care you receive before the baby's birth. This care will help prevent, find, and treat any problems during the pregnancy and childbirth. Body changes during your first trimester Your body goes through many changes during pregnancy. The changes vary from woman to woman.  You may gain or lose a couple of pounds at first.  You may feel sick to your stomach (nauseous) and you may throw up (vomit). If the vomiting is uncontrollable, call your health care provider.  You may tire easily.  You may develop headaches that can be relieved by medicines. All medicines should be approved by your health care provider.  You may urinate more often. Painful urination may mean you have a bladder infection.  You may develop heartburn as a result of your pregnancy.  You may develop constipation because certain hormones are causing the muscles that push stool through your intestines to slow down.  You may develop hemorrhoids or swollen veins (varicose veins).  Your breasts may begin to grow larger and become tender. Your nipples may stick out more, and the tissue that surrounds them (areola) may become darker.  Your gums may bleed and may be  sensitive to brushing and flossing.  Dark spots or blotches (chloasma, mask of pregnancy) may develop on your face. This will likely fade after the baby is born.  Your menstrual periods will stop.  You may have a loss of appetite.  You may develop cravings for certain kinds of food.  You may have changes in your emotions from day to day, such as being excited to be pregnant or being concerned that something may go wrong with the pregnancy and baby.  You may have more vivid and strange dreams.  You may have changes in your hair. These can include thickening of your hair, rapid growth, and changes in texture. Some women also have hair loss during or after pregnancy, or hair that feels dry or thin. Your hair will most likely return to normal after your baby is born. What to expect at prenatal visits During a routine prenatal visit:  You will be weighed to make sure you and the baby are growing normally.  Your blood pressure will be taken.  Your abdomen will be measured to track your baby's growth.  The fetal heartbeat will be listened to between weeks 10 and 14 of your pregnancy.  Test results from any previous visits will be discussed. Your health care provider may ask you:  How you are feeling.  If you are feeling the baby move.  If you have had any abnormal symptoms, such as leaking fluid, bleeding, severe headaches, or  abdominal cramping.  If you are using any tobacco products, including cigarettes, chewing tobacco, and electronic cigarettes.  If you have any questions. Other tests that may be performed during your first trimester include:  Blood tests to find your blood type and to check for the presence of any previous infections. The tests will also be used to check for low iron levels (anemia) and protein on red blood cells (Rh antibodies). Depending on your risk factors, or if you previously had diabetes during pregnancy, you may have tests to check for high blood sugar  that affects pregnant women (gestational diabetes).  Urine tests to check for infections, diabetes, or protein in the urine.  An ultrasound to confirm the proper growth and development of the baby.  Fetal screens for spinal cord problems (spina bifida) and Down syndrome.  HIV (human immunodeficiency virus) testing. Routine prenatal testing includes screening for HIV, unless you choose not to have this test.  You may need other tests to make sure you and the baby are doing well. Follow these instructions at home: Medicines  Follow your health care provider's instructions regarding medicine use. Specific medicines may be either safe or unsafe to take during pregnancy.  Take a prenatal vitamin that contains at least 600 micrograms (mcg) of folic acid.  If you develop constipation, try taking a stool softener if your health care provider approves. Eating and drinking   Eat a balanced diet that includes fresh fruits and vegetables, whole grains, good sources of protein such as meat, eggs, or tofu, and low-fat dairy. Your health care provider will help you determine the amount of weight gain that is right for you.  Avoid raw meat and uncooked cheese. These carry germs that can cause birth defects in the baby.  Eating four or five small meals rather than three large meals a day may help relieve nausea and vomiting. If you start to feel nauseous, eating a few soda crackers can be helpful. Drinking liquids between meals, instead of during meals, also seems to help ease nausea and vomiting.  Limit foods that are high in fat and processed sugars, such as fried and sweet foods.  To prevent constipation: ? Eat foods that are high in fiber, such as fresh fruits and vegetables, whole grains, and beans. ? Drink enough fluid to keep your urine clear or pale yellow. Activity  Exercise only as directed by your health care provider. Most women can continue their usual exercise routine during  pregnancy. Try to exercise for 30 minutes at least 5 days a week. Exercising will help you: ? Control your weight. ? Stay in shape. ? Be prepared for labor and delivery.  Experiencing pain or cramping in the lower abdomen or lower back is a good sign that you should stop exercising. Check with your health care provider before continuing with normal exercises.  Try to avoid standing for long periods of time. Move your legs often if you must stand in one place for a long time.  Avoid heavy lifting.  Wear low-heeled shoes and practice good posture.  You may continue to have sex unless your health care provider tells you not to. Relieving pain and discomfort  Wear a good support bra to relieve breast tenderness.  Take warm sitz baths to soothe any pain or discomfort caused by hemorrhoids. Use hemorrhoid cream if your health care provider approves.  Rest with your legs elevated if you have leg cramps or low back pain.  If you develop varicose veins  in your legs, wear support hose. Elevate your feet for 15 minutes, 3-4 times a day. Limit salt in your diet. Prenatal care  Schedule your prenatal visits by the twelfth week of pregnancy. They are usually scheduled monthly at first, then more often in the last 2 months before delivery.  Write down your questions. Take them to your prenatal visits.  Keep all your prenatal visits as told by your health care provider. This is important. Safety  Wear your seat belt at all times when driving.  Make a list of emergency phone numbers, including numbers for family, friends, the hospital, and police and fire departments. General instructions  Ask your health care provider for a referral to a local prenatal education class. Begin classes no later than the beginning of month 6 of your pregnancy.  Ask for help if you have counseling or nutritional needs during pregnancy. Your health care provider can offer advice or refer you to specialists for help  with various needs.  Do not use hot tubs, steam rooms, or saunas.  Do not douche or use tampons or scented sanitary pads.  Do not cross your legs for long periods of time.  Avoid cat litter boxes and soil used by cats. These carry germs that can cause birth defects in the baby and possibly loss of the fetus by miscarriage or stillbirth.  Avoid all smoking, herbs, alcohol, and medicines not prescribed by your health care provider. Chemicals in these products affect the formation and growth of the baby.  Do not use any products that contain nicotine or tobacco, such as cigarettes and e-cigarettes. If you need help quitting, ask your health care provider. You may receive counseling support and other resources to help you quit.  Schedule a dentist appointment. At home, brush your teeth with a soft toothbrush and be gentle when you floss. Contact a health care provider if:  You have dizziness.  You have mild pelvic cramps, pelvic pressure, or nagging pain in the abdominal area.  You have persistent nausea, vomiting, or diarrhea.  You have a bad smelling vaginal discharge.  You have pain when you urinate.  You notice increased swelling in your face, hands, legs, or ankles.  You are exposed to fifth disease or chickenpox.  You are exposed to [place] measles (rubella) and have never had it. Get help right away if:  You have a fever.  You are leaking fluid from your vagina.  You have spotting or bleeding from your vagina.  You have severe abdominal cramping or pain.  You have rapid weight gain or loss.  You vomit blood or material that looks like coffee grounds.  You develop a severe headache.  You have shortness of breath.  You have any kind of trauma, such as from a fall or a car accident. Summary  The first trimester of pregnancy is from week 1 until the end of week 13 (months 1 through 3).  Your body goes through many changes during pregnancy. The changes vary from  woman to woman.  You will have routine prenatal visits. During those visits, your health care provider will examine you, discuss any test results you may have, and talk with you about how you are feeling. This information is not intended to replace advice given to you by your health care provider. Make sure you discuss any questions you have with your health care provider. Document Released: 02/02/2001 Document Revised: 01/21/2016 Document Reviewed: 01/21/2016 Elsevier Interactive Patient Education  2019 [employer].  Second Trimester of Pregnancy The second trimester is from week 14 through week 27 (months 4 through 6). The second trimester is often a time when you feel your best. Your body has adjusted to being pregnant, and you begin to feel better physically. Usually, morning sickness has lessened or quit completely, you may have more energy, and you may have an increase in appetite. The second trimester is also a time when the fetus is growing rapidly. At the end of the sixth month, the fetus is about 9 inches long and weighs about 1 pounds. You will likely begin to feel the baby move (quickening) between 16 and 20 weeks of pregnancy. Body changes during your second trimester Your body continues to go through many changes during your second trimester. The changes vary from woman to woman.  Your weight will continue to increase. You will notice your lower abdomen bulging out.  You may begin to get stretch marks on your hips, abdomen, and breasts.  You may develop headaches that can be relieved by medicines. The medicines should be approved by your health care provider.  You may urinate more often because the fetus is pressing on your bladder.  You may develop or continue to have heartburn as a result of your pregnancy.  You may develop constipation because certain hormones are causing the muscles that push waste through your intestines to slow down.  You may develop hemorrhoids or  swollen, bulging veins (varicose veins).  You may have back pain. This is caused by: ? Weight gain. ? Pregnancy hormones that are relaxing the joints in your pelvis. ? A shift in weight and the muscles that support your balance.  Your breasts will continue to grow and they will continue to become tender.  Your gums may bleed and may be sensitive to brushing and flossing.  Dark spots or blotches (chloasma, mask of pregnancy) may develop on your face. This will likely fade after the baby is born.  A dark line from your belly button to the pubic area (linea nigra) may appear. This will likely fade after the baby is born.  You may have changes in your hair. These can include thickening of your hair, rapid growth, and changes in texture. Some women also have hair loss during or after pregnancy, or hair that feels dry or thin. Your hair will most likely return to normal after your baby is born. What to expect at prenatal visits During a routine prenatal visit:  You will be weighed to make sure you and the fetus are growing normally.  Your blood pressure will be taken.  Your abdomen will be measured to track your baby's growth.  The fetal heartbeat will be listened to.  Any test results from the previous visit will be discussed. Your health care provider may ask you:  How you are feeling.  If you are feeling the baby move.  If you have had any abnormal symptoms, such as leaking fluid, bleeding, severe headaches, or abdominal cramping.  If you are using any tobacco products, including cigarettes, chewing tobacco, and electronic cigarettes.  If you have any questions. Other tests that may be performed during your second trimester include:  Blood tests that check for: ? Low iron levels (anemia). ? High blood sugar that affects pregnant women (gestational diabetes) between 43 and 28 weeks. ? Rh antibodies. This is to check for a protein on red blood cells (Rh factor).  Urine tests  to check for infections, diabetes, or protein in  the urine.  An ultrasound to confirm the proper growth and development of the baby.  An amniocentesis to check for possible genetic problems.  Fetal screens for spina bifida and Down syndrome.  HIV (human immunodeficiency virus) testing. Routine prenatal testing includes screening for HIV, unless you choose not to have this test. Follow these instructions at home: Medicines  Follow your health care provider's instructions regarding medicine use. Specific medicines may be either safe or unsafe to take during pregnancy.  Take a prenatal vitamin that contains at least 600 micrograms (mcg) of folic acid.  If you develop constipation, try taking a stool softener if your health care provider approves. Eating and drinking   Eat a balanced diet that includes fresh fruits and vegetables, whole grains, good sources of protein such as meat, eggs, or tofu, and low-fat dairy. Your health care provider will help you determine the amount of weight gain that is right for you.  Avoid raw meat and uncooked cheese. These carry germs that can cause birth defects in the baby.  If you have low calcium intake from food, talk to your health care provider about whether you should take a daily calcium supplement.  Limit foods that are high in fat and processed sugars, such as fried and sweet foods.  To prevent constipation: ? Drink enough fluid to keep your urine clear or pale yellow. ? Eat foods that are high in fiber, such as fresh fruits and vegetables, whole grains, and beans. Activity  Exercise only as directed by your health care provider. Most women can continue their usual exercise routine during pregnancy. Try to exercise for 30 minutes at least 5 days a week. Stop exercising if you experience uterine contractions.  Avoid heavy lifting, wear low heel shoes, and practice good posture.  A sexual relationship may be continued unless your health care  provider directs you otherwise. Relieving pain and discomfort  Wear a good support bra to prevent discomfort from breast tenderness.  Take warm sitz baths to soothe any pain or discomfort caused by hemorrhoids. Use hemorrhoid cream if your health care provider approves.  Rest with your legs elevated if you have leg cramps or low back pain.  If you develop varicose veins, wear support hose. Elevate your feet for 15 minutes, 3-4 times a day. Limit salt in your diet. Prenatal Care  Write down your questions. Take them to your prenatal visits.  Keep all your prenatal visits as told by your health care provider. This is important. Safety  Wear your seat belt at all times when driving.  Make a list of emergency phone numbers, including numbers for family, friends, the hospital, and police and fire departments. General instructions  Ask your health care provider for a referral to a local prenatal education class. Begin classes no later than the beginning of month 6 of your pregnancy.  Ask for help if you have counseling or nutritional needs during pregnancy. Your health care provider can offer advice or refer you to specialists for help with various needs.  Do not use hot tubs, steam rooms, or saunas.  Do not douche or use tampons or scented sanitary pads.  Do not cross your legs for long periods of time.  Avoid cat litter boxes and soil used by cats. These carry germs that can cause birth defects in the baby and possibly loss of the fetus by miscarriage or stillbirth.  Avoid all smoking, herbs, alcohol, and unprescribed drugs. Chemicals in these products can affect the formation  and growth of the baby.  Do not use any products that contain nicotine or tobacco, such as cigarettes and e-cigarettes. If you need help quitting, ask your health care provider.  Visit your dentist if you have not gone yet during your pregnancy. Use a soft toothbrush to brush your teeth and be gentle when you  floss. Contact a health care provider if:  You have dizziness.  You have mild pelvic cramps, pelvic pressure, or nagging pain in the abdominal area.  You have persistent nausea, vomiting, or diarrhea.  You have a bad smelling vaginal discharge.  You have pain when you urinate. Get help right away if:  You have a fever.  You are leaking fluid from your vagina.  You have spotting or bleeding from your vagina.  You have severe abdominal cramping or pain.  You have rapid weight gain or weight loss.  You have shortness of breath with chest pain.  You notice sudden or extreme swelling of your face, hands, ankles, feet, or legs.  You have not felt your baby move in over an hour.  You have severe headaches that do not go away when you take medicine.  You have vision changes. Summary  The second trimester is from week 14 through week 27 (months 4 through 6). It is also a time when the fetus is growing rapidly.  Your body goes through many changes during pregnancy. The changes vary from woman to woman.  Avoid all smoking, herbs, alcohol, and unprescribed drugs. These chemicals affect the formation and growth your baby.  Do not use any tobacco products, such as cigarettes, chewing tobacco, and e-cigarettes. If you need help quitting, ask your health care provider.  Contact your health care provider if you have any questions. Keep all prenatal visits as told by your health care provider. This is important. This information is not intended to replace advice given to you by your health care provider. Make sure you discuss any questions you have with your health care provider. Document Released: 02/02/2001 Document Revised: 03/16/2016 Document Reviewed: 03/16/2016 Elsevier Interactive Patient Education  2019 [employer].   Contraception Choices Contraception, also called birth control, refers to methods or devices that prevent pregnancy. Hormonal methods Contraceptive  implant  A contraceptive implant is a thin, plastic tube that contains a hormone. It is inserted into the upper part of the arm. It can remain in place for up to 3 years. Progestin-only injections Progestin-only injections are injections of progestin, a synthetic form of the hormone progesterone. They are given every 3 months by a health care provider. Birth control pills  Birth control pills are pills that contain hormones that prevent pregnancy. They must be taken once a day, preferably at the same time each day. Birth control patch  The birth control patch contains hormones that prevent pregnancy. It is placed on the skin and must be changed once a week for three weeks and removed on the fourth week. A prescription is needed to use this method of contraception. Vaginal ring  A vaginal ring contains hormones that prevent pregnancy. It is placed in the vagina for three weeks and removed on the fourth week. After that, the process is repeated with a new ring. A prescription is needed to use this method of contraception. Emergency contraceptive Emergency contraceptives prevent pregnancy after unprotected sex. They come in pill form and can be taken up to 5 days after sex. They work best the sooner they are taken after having sex. Most emergency  contraceptives are available without a prescription. This method should not be used as your only form of birth control. Barrier methods Female condom  A female condom is a thin sheath that is worn over the penis during sex. Condoms keep sperm from going inside a woman's body. They can be used with a spermicide to increase their effectiveness. They should be disposed after a single use. Female condom  A female condom is a soft, loose-fitting sheath that is put into the vagina before sex. The condom keeps sperm from going inside a woman's body. They should be disposed after a single use. Diaphragm  A diaphragm is a soft, dome-shaped barrier. It is inserted  into the vagina before sex, along with a spermicide. The diaphragm blocks sperm from entering the uterus, and the spermicide kills sperm. A diaphragm should be left in the vagina for 6-8 hours after sex and removed within 24 hours. A diaphragm is prescribed and fitted by a health care provider. A diaphragm should be replaced every 1-2 years, after giving birth, after gaining more than 15 lb (6.8 kg), and after pelvic surgery. Cervical cap  A cervical cap is a round, soft latex or plastic cup that fits over the cervix. It is inserted into the vagina before sex, along with spermicide. It blocks sperm from entering the uterus. The cap should be left in place for 6-8 hours after sex and removed within 48 hours. A cervical cap must be prescribed and fitted by a health care provider. It should be replaced every 2 years. Sponge  A sponge is a soft, circular piece of polyurethane foam with spermicide on it. The sponge helps block sperm from entering the uterus, and the spermicide kills sperm. To use it, you make it wet and then insert it into the vagina. It should be inserted before sex, left in for at least 6 hours after sex, and removed and thrown away within 30 hours. Spermicides Spermicides are chemicals that kill or block sperm from entering the cervix and uterus. They can come as a cream, jelly, suppository, foam, or tablet. A spermicide should be inserted into the vagina with an applicator at least 16-10 minutes before sex to allow time for it to work. The process must be repeated every time you have sex. Spermicides do not require a prescription. Intrauterine contraception Intrauterine device (IUD) An IUD is a T-shaped device that is put in a woman's uterus. There are two types:  Hormone IUD.This type contains progestin, a synthetic form of the hormone progesterone. This type can stay in place for 3-5 years.  Copper IUD.This type is wrapped in copper wire. It can stay in place for 10  years.  Permanent methods of contraception Female tubal ligation In this method, a woman's fallopian tubes are sealed, tied, or blocked during surgery to prevent eggs from traveling to the uterus. Hysteroscopic sterilization In this method, a small, flexible insert is placed into each fallopian tube. The inserts cause scar tissue to form in the fallopian tubes and block them, so sperm cannot reach an egg. The procedure takes about 3 months to be effective. Another form of birth control must be used during those 3 months. Female sterilization This is a procedure to tie off the tubes that carry sperm (vasectomy). After the procedure, the man can still ejaculate fluid (semen). Natural planning methods Natural family planning In this method, a couple does not have sex on days when the woman could become pregnant. Calendar method This means keeping track  of the length of each menstrual cycle, identifying the days when pregnancy can happen, and not having sex on those days. Ovulation method In this method, a couple avoids sex during ovulation. Symptothermal method This method involves not having sex during ovulation. The woman typically checks for ovulation by watching changes in her temperature and in the consistency of cervical mucus. Post-ovulation method In this method, a couple waits to have sex until after ovulation. Summary  Contraception, also called birth control, means methods or devices that prevent pregnancy.  Hormonal methods of contraception include implants, injections, pills, patches, vaginal rings, and emergency contraceptives.  Barrier methods of contraception can include female condoms, female condoms, diaphragms, cervical caps, sponges, and spermicides.  There are two types of IUDs (intrauterine devices). An IUD can be put in a woman's uterus to prevent pregnancy for 3-5 years.  Permanent sterilization can be done through a procedure for males, females, or both.  Natural  family planning methods involve not having sex on days when the woman could become pregnant. This information is not intended to replace advice given to you by your health care provider. Make sure you discuss any questions you have with your health care provider. Document Released: 02/08/2005 Document Revised: 02/10/2017 Document Reviewed: 03/13/2016 Elsevier Interactive Patient Education  2019 [employer].   Breastfeeding  Choosing to breastfeed is one of the best decisions you can make for yourself and your baby. A change in hormones during pregnancy causes your breasts to make breast milk in your milk-producing glands. Hormones prevent breast milk from being released before your baby is born. They also prompt milk flow after birth. Once breastfeeding has begun, thoughts of your baby, as well as his or her sucking or crying, can stimulate the release of milk from your milk-producing glands. Benefits of breastfeeding Research shows that breastfeeding offers many health benefits for infants and mothers. It also offers a cost-free and convenient way to feed your baby. For your baby  Your first milk (colostrum) helps your baby's digestive system to function better.  Special cells in your milk (antibodies) help your baby to fight off infections.  Breastfed babies are less likely to develop asthma, allergies, obesity, or type 2 diabetes. They are also at lower risk for sudden infant death syndrome (SIDS).  Nutrients in breast milk are better able to meet your babys needs compared to infant formula.  Breast milk improves your baby's brain development. For you  Breastfeeding helps to create a very special bond between you and your baby.  Breastfeeding is convenient. Breast milk costs nothing and is always available at the correct temperature.  Breastfeeding helps to burn calories. It helps you to lose the weight that you gained during pregnancy.  Breastfeeding makes your uterus return  faster to its size before pregnancy. It also slows bleeding (lochia) after you give birth.  Breastfeeding helps to lower your risk of developing type 2 diabetes, osteoporosis, rheumatoid arthritis, cardiovascular disease, and breast, ovarian, uterine, and endometrial cancer later in life. Breastfeeding basics Starting breastfeeding  Find a comfortable place to sit or lie down, with your neck and back well-supported.  Place a pillow or a rolled-up blanket under your baby to bring him or her to the level of your breast (if you are seated). Nursing pillows are specially designed to help support your arms and your baby while you breastfeed.  Make sure that your baby's tummy (abdomen) is facing your abdomen.  Gently massage your breast. With your fingertips, massage from the  outer edges of your breast inward toward the nipple. This encourages milk flow. If your milk flows slowly, you may need to continue this action during the feeding.  Support your breast with 4 fingers underneath and your thumb above your nipple (make the letter "C" with your hand). Make sure your fingers are well away from your nipple and your babys mouth.  Stroke your baby's lips gently with your finger or nipple.  When your baby's mouth is open wide enough, quickly bring your baby to your breast, placing your entire nipple and as much of the areola as possible into your baby's mouth. The areola is the colored area around your nipple. ? More areola should be visible above your baby's upper lip than below the lower lip. ? Your baby's lips should be opened and extended outward (flanged) to ensure an adequate, comfortable latch. ? Your baby's tongue should be between his or her lower gum and your breast.  Make sure that your baby's mouth is correctly positioned around your nipple (latched). Your baby's lips should create a seal on your breast and be turned out (everted).  It is common for your baby to suck about 2-3 minutes in  order to start the flow of breast milk. Latching Teaching your baby how to latch onto your breast properly is very important. An improper latch can cause nipple pain, decreased milk supply, and poor weight gain in your baby. Also, if your baby is not latched onto your nipple properly, he or she may swallow some air during feeding. This can make your baby fussy. Burping your baby when you switch breasts during the feeding can help to get rid of the air. However, teaching your baby to latch on properly is still the best way to prevent fussiness from swallowing air while breastfeeding. Signs that your baby has successfully latched onto your nipple  Silent tugging or silent sucking, without causing you pain. Infant's lips should be extended outward (flanged).  Swallowing heard between every 3-4 sucks once your milk has started to flow (after your let-down milk reflex occurs).  Muscle movement above and in front of his or her ears while sucking. Signs that your baby has not successfully latched onto your nipple  Sucking sounds or smacking sounds from your baby while breastfeeding.  Nipple pain. If you think your baby has not latched on correctly, slip your finger into the corner of your babys mouth to break the suction and place it between your baby's gums. Attempt to start breastfeeding again. Signs of successful breastfeeding Signs from your baby  Your baby will gradually decrease the number of sucks or will completely stop sucking.  Your baby will fall asleep.  Your baby's body will relax.  Your baby will retain a small amount of milk in his or her mouth.  Your baby will let go of your breast by himself or herself. Signs from you  Breasts that have increased in firmness, weight, and size 1-3 hours after feeding.  Breasts that are softer immediately after breastfeeding.  Increased milk volume, as well as a change in milk consistency and color by the fifth day of  breastfeeding.  Nipples that are not sore, cracked, or bleeding. Signs that your baby is getting enough milk  Wetting at least 1-2 diapers during the first 24 hours after birth.  Wetting at least 5-6 diapers every 24 hours for the first week after birth. The urine should be clear or pale yellow by the age of 5 days.  Wetting 6-8 diapers every 24 hours as your baby continues to grow and develop.  At least 3 stools in a 24-hour period by the age of 5 days. The stool should be soft and yellow.  At least 3 stools in a 24-hour period by the age of 7 days. The stool should be seedy and yellow.  No loss of weight greater than 10% of birth weight during the first 3 days of life.  Average weight gain of 4-7 oz (113-198 g) per week after the age of 4 days.  Consistent daily weight gain by the age of 5 days, without weight loss after the age of 2 weeks. After a feeding, your baby may spit up a small amount of milk. This is normal. Breastfeeding frequency and duration Frequent feeding will help you make more milk and can prevent sore nipples and extremely full breasts (breast engorgement). Breastfeed when you feel the need to reduce the fullness of your breasts or when your baby shows signs of hunger. This is called "breastfeeding on demand." Signs that your baby is hungry include:  Increased alertness, activity, or restlessness.  Movement of the head from side to side.  Opening of the mouth when the corner of the mouth or cheek is stroked (rooting).  Increased sucking sounds, smacking lips, cooing, sighing, or squeaking.  Hand-to-mouth movements and sucking on fingers or hands.  Fussing or crying. Avoid introducing a pacifier to your baby in the first 4-6 weeks after your baby is born. After this time, you may choose to use a pacifier. Research has shown that pacifier use during the first year of a baby's life decreases the risk of sudden infant death syndrome (SIDS). Allow your baby to feed  on each breast as long as he or she wants. When your baby unlatches or falls asleep while feeding from the first breast, offer the second breast. Because newborns are often sleepy in the first few weeks of life, you may need to awaken your baby to get him or her to feed. Breastfeeding times will vary from baby to baby. However, the following rules can serve as a guide to help you make sure that your baby is properly fed:  Newborns (babies 32 weeks of age or younger) may breastfeed every 1-3 hours.  Newborns should not go without breastfeeding for longer than 3 hours during the day or 5 hours during the night.  You should breastfeed your baby a minimum of 8 times in a 24-hour period. Breast milk pumping     Pumping and storing breast milk allows you to make sure that your baby is exclusively fed your breast milk, even at times when you are unable to breastfeed. This is especially important if you go back to work while you are still breastfeeding, or if you are not able to be present during feedings. Your lactation consultant can help you find a method of pumping that works best for you and give you guidelines about how long it is safe to store breast milk. Caring for your breasts while you breastfeed Nipples can become dry, cracked, and sore while breastfeeding. The following recommendations can help keep your breasts moisturized and healthy:  Avoid using soap on your nipples.  Wear a supportive bra designed especially for nursing. Avoid wearing underwire-style bras or extremely tight bras (sports bras).  Air-dry your nipples for 3-4 minutes after each feeding.  Use only cotton bra pads to absorb leaked breast milk. Leaking of breast milk between feedings is normal.  Use lanolin on your nipples after breastfeeding. Lanolin helps to maintain your skin's normal moisture barrier. Pure lanolin is not harmful (not toxic) to your baby. You may also hand express a few drops of breast milk and gently  massage that milk into your nipples and allow the milk to air-dry. In the first few weeks after giving birth, some women experience breast engorgement. Engorgement can make your breasts feel heavy, warm, and tender to the touch. Engorgement peaks within 3-5 days after you give birth. The following recommendations can help to ease engorgement:  Completely empty your breasts while breastfeeding or pumping. You may want to start by applying warm, moist heat (in the shower or with warm, water-soaked hand towels) just before feeding or pumping. This increases circulation and helps the milk flow. If your baby does not completely empty your breasts while breastfeeding, pump any extra milk after he or she is finished.  Apply ice packs to your breasts immediately after breastfeeding or pumping, unless this is too uncomfortable for you. To do this: ? Put ice in a plastic bag. ? Place a towel between your skin and the bag. ? Leave the ice on for 20 minutes, 2-3 times a day.  Make sure that your baby is latched on and positioned properly while breastfeeding. If engorgement persists after 48 hours of following these recommendations, contact your health care provider or a Science writer. Overall health care recommendations while breastfeeding  Eat 3 healthy meals and 3 snacks every day. Well-nourished mothers who are breastfeeding need an additional 450-500 calories a day. You can meet this requirement by increasing the amount of a balanced diet that you eat.  Drink enough water to keep your urine pale yellow or clear.  Rest often, relax, and continue to take your prenatal vitamins to prevent fatigue, stress, and low vitamin and mineral levels in your body (nutrient deficiencies).  Do not use any products that contain nicotine or tobacco, such as cigarettes and e-cigarettes. Your baby may be harmed by chemicals from cigarettes that pass into breast milk and exposure to secondhand smoke. If you need help  quitting, ask your health care provider.  Avoid alcohol.  Do not use illegal drugs or marijuana.  Talk with your health care provider before taking any medicines. These include over-the-counter and prescription medicines as well as vitamins and herbal supplements. Some medicines that may be harmful to your baby can pass through breast milk.  It is possible to become pregnant while breastfeeding. If birth control is desired, ask your health care provider about options that will be safe while breastfeeding your baby. Where to find more information: [employer]: www.llli.org Contact a health care provider if:  You feel like you want to stop breastfeeding or have become frustrated with breastfeeding.  Your nipples are cracked or bleeding.  Your breasts are red, tender, or warm.  You have: ? Painful breasts or nipples. ? A swollen area on either breast. ? A fever or chills. ? Nausea or vomiting. ? Drainage other than breast milk from your nipples.  Your breasts do not become full before feedings by the fifth day after you give birth.  You feel sad and depressed.  Your baby is: ? Too sleepy to eat well. ? Having trouble sleeping. ? More than 7 week old and wetting fewer than 6 diapers in a 24-hour period. ? Not gaining weight by 56 days of age.  Your baby has fewer than 3 stools in a 24-hour period.  Your baby's skin or the white parts of his or her eyes become yellow. Get help right away if:  Your baby is overly tired (lethargic) and does not want to wake up and feed.  Your baby develops an unexplained fever. Summary  Breastfeeding offers many health benefits for infant and mothers.  Try to breastfeed your infant when he or she shows early signs of hunger.  Gently tickle or stroke your baby's lips with your finger or nipple to allow the baby to open his or her mouth. Bring the baby to your breast. Make sure that much of the areola is in your baby's mouth.  Offer one side and burp the baby before you offer the other side.  Talk with your health care provider or lactation consultant if you have questions or you face problems as you breastfeed. This information is not intended to replace advice given to you by your health care provider. Make sure you discuss any questions you have with your health care provider. Document Released: 02/08/2005 Document Revised: 03/12/2016 Document Reviewed: 03/12/2016 Elsevier Interactive Patient Education  2019 [employer]???  You must attend a Doren Custard class at [facility]  3rd Wednesday of every month from [employer] by calling [phone] or online at [email or web address]  Bring [place] the certificate from the class  Waterbirth supplies needed for [facility] patients:  Our practice has a Heritage manager in a Box tub at the hospital that you can borrow  You will need to purchase an accessory kit that has all needed supplies through [facility] [phone]) or online $175.00  Or you can purchase the supplies separately: o Single-use disposable tub liner for Birth Pool in a Box (REGULAR size) o New garden hose labeled "lead-free", suitable for drinking water", o Electric drain pump to remove water (We recommend 792 gallon per hour or greater pump.)  o  "non-toxic" OR "water potable o Garden hose to remove the dirty water o Fish net o Bathing suit top (optional) o Long-handled mirror (optional)  [employer] sells tubs for ~ $120 if you would rather purchase your own tub.  They also sell accessories, liners.    Www.waterbirthsolutions.com for tub purchases and supplies  [employer] Ladies (www.thelaborladies.com) $275 for tub rental/set-up & take down/kit   [employer] information regarding doulas (labor support) who provide pool rentals:  IdentityList.se.htm   [employer] Labor Ladies  (www.thelaborladies.com)  IdentityList.se.htm   Things that would prevent you from having a waterbirth:  Premature, <37wks  Previous cesarean birth  Presence of thick meconium-stained fluid  Multiple gestation (Twins, triplets, etc.)  Uncontrolled diabetes or gestational diabetes requiring medication  Hypertension  Heavy vaginal bleeding  Non-reassuring fetal heart rate  Active infection (MRSA, etc.)  If your labor has to be induced and induction method requires continuous monitoring of the baby's heart rate  Other risks/issues identified by your obstetrical provider

## 2018-06-12 NOTE — Addendum Note (Signed)
Addended by: Catalina Antigua on: 06/12/2018 02:11 PM   Modules accepted: Orders

## 2018-06-12 NOTE — Progress Notes (Signed)
  Subjective:    Stephanie Woods is a G2P0010 Unknown being seen today for her first obstetrical visit.  Her obstetrical history is significant for first pregnancy. Patient does intend to breast feed. Pregnancy history fully reviewed.  Patient reports nausea.  Vitals:   06/12/18 1320 06/12/18 1334  BP: 104/70   Pulse: 85   Temp: 98 F (36.7 C)   Weight: 183 lb (83 kg)   Height:  5\' 1"  (1.549 m)    HISTORY: OB History  Gravida Para Term Preterm AB Living  2       1    SAB TAB Ectopic Multiple Live Births    1          # Outcome Date GA Lbr Len/2nd Weight Sex Delivery Anes PTL Lv  2 Current           1 TAB 02/23/08           Past Medical History:  Diagnosis Date  . Asthma    Past Surgical History:  Procedure Laterality Date  . TONSILLECTOMY     Family History  Problem Relation Age of Onset  . Hypertension Maternal Grandmother   . Hypertension Mother   . Healthy Father      Exam    Uterus:   10-weeks sixe  Pelvic Exam:    Perineum: Normal Perineum   Vulva: normal   Vagina:  normal mucosa, normal discharge   pH:    Cervix: nulliparous appearance and cervix is closed and long   Adnexa: no mass, fullness, tenderness   Bony Pelvis: gynecoid  System: Breast:  normal appearance, no masses or tenderness   Skin: normal coloration and turgor, no rashes    Neurologic: oriented, no focal deficits   Extremities: normal strength, tone, and muscle mass   HEENT extra ocular movement intact   Mouth/Teeth mucous membranes moist, pharynx normal without lesions and dental hygiene good   Neck supple and no masses   Cardiovascular: regular rate and rhythm   Respiratory:  appears well, vitals normal, no respiratory distress, acyanotic, normal RR, chest clear, no wheezing, crepitations, rhonchi, normal symmetric air entry   Abdomen: soft, non-tender; bowel sounds normal; no masses,  no organomegaly   Urinary:       Assessment:    Pregnancy: G2P0010 Patient Active Problem  List   Diagnosis Date Noted  . Supervision of normal first pregnancy 06/12/2018  . Cigarette nicotine dependence without complication 04/16/2018  . Other insomnia 04/13/2018        Plan:     Initial labs drawn. Prenatal vitamins. Problem list reviewed and updated. Genetic Screening discussed : Panorama and Horizon ordered.  Ultrasound discussed; fetal survey: requested. Patient is interested in waterbirth  Follow up in 4 weeks. Patient understands that her next visit will be a telehealth visit. Patient has been enrolled in BR[facility] and should receive a BP cuff 50% of 30 min visit spent on counseling and coordination of care.     Latresa Gasser 06/12/2018

## 2018-06-12 NOTE — Progress Notes (Signed)
NOB Planned : Yes and No per pt  Genetic Screening: Yes  Last Pap: 01/30/2018 Prenatal Vitamins make her sick.  Pt states she was taking Chantix in early March before she knew she was pregnant pt states she took for 13 days.

## 2018-06-13 LAB — OBSTETRIC PANEL, INCLUDING HIV
Antibody Screen: NEGATIVE
Basophils Absolute: 0.1 10*3/uL (ref 0.0–0.2)
Basos: 1 %
EOS (ABSOLUTE): 0 10*3/uL (ref 0.0–0.4)
Eos: 1 %
HIV Screen 4th Generation wRfx: NONREACTIVE
Hematocrit: 32.6 % — ABNORMAL LOW (ref 34.0–46.6)
Hemoglobin: 10.6 g/dL — ABNORMAL LOW (ref 11.1–15.9)
Hepatitis B Surface Ag: NEGATIVE
Immature Grans (Abs): 0 10*3/uL (ref 0.0–0.1)
Immature Granulocytes: 0 %
Lymphocytes Absolute: 2.2 10*3/uL (ref 0.7–3.1)
Lymphs: 32 %
MCH: 27.1 pg (ref 26.6–33.0)
MCHC: 32.5 g/dL (ref 31.5–35.7)
MCV: 83 fL (ref 79–97)
Monocytes Absolute: 0.7 10*3/uL (ref 0.1–0.9)
Monocytes: 9 %
Neutrophils Absolute: 4 10*3/uL (ref 1.4–7.0)
Neutrophils: 57 %
Platelets: 281 10*3/uL (ref 150–450)
RBC: 3.91 x10E6/uL (ref 3.77–5.28)
RDW: 12.7 % (ref 11.7–15.4)
RPR Ser Ql: NONREACTIVE
Rh Factor: POSITIVE
Rubella Antibodies, IGG: 4.51 index (ref >0.99)
WBC: 7 10*3/uL (ref 3.4–10.8)

## 2018-06-13 LAB — GC/CHLAMYDIA PROBE AMP (~~LOC~~) NOT AT ARMC
Chlamydia: NEGATIVE
Neisseria Gonorrhea: NEGATIVE

## 2018-06-14 LAB — CULTURE, OB URINE

## 2018-06-14 LAB — URINE CULTURE, OB REFLEX

## 2018-06-19 ENCOUNTER — Encounter: Payer: Self-pay | Admitting: Obstetrics and Gynecology

## 2018-06-20 ENCOUNTER — Encounter: Payer: Self-pay | Admitting: Obstetrics and Gynecology

## 2018-07-10 ENCOUNTER — Ambulatory Visit (INDEPENDENT_AMBULATORY_CARE_PROVIDER_SITE_OTHER): Payer: BLUE CROSS/BLUE SHIELD | Admitting: Obstetrics & Gynecology

## 2018-07-10 ENCOUNTER — Other Ambulatory Visit: Payer: Self-pay

## 2018-07-10 VITALS — BP 110/66

## 2018-07-10 DIAGNOSIS — Z3A14 14 weeks gestation of pregnancy: Secondary | ICD-10-CM

## 2018-07-10 DIAGNOSIS — Z3402 Encounter for supervision of normal first pregnancy, second trimester: Secondary | ICD-10-CM

## 2018-07-10 DIAGNOSIS — G4709 Other insomnia: Secondary | ICD-10-CM

## 2018-07-10 NOTE — Progress Notes (Signed)
Pt is on the phone preparing for virtual MyChart visit with provider. [redacted]w[redacted]d. Anatomy scan [place] scheduled for 08/14/18.

## 2018-07-10 NOTE — Patient Instructions (Signed)
Return to office for any scheduled appointments. Call the office or go to the MAU at [facility] if:  You begin to have strong, frequent contractions  Your water breaks.  Sometimes it is a big gush of fluid, sometimes it is just a trickle that keeps getting your panties wet or running down your legs  You have vaginal bleeding.  It is normal to have a small amount of spotting if your cervix was checked.   You do not feel your baby moving like normal.  If you do not, get something to eat and drink and lay down and focus on feeling your baby move.   If your baby is still not moving like normal, you should call the office or go to MAU.  Any other obstetric concerns.   

## 2018-07-10 NOTE — Progress Notes (Signed)
   TELEHEALTH OBSTETRICS PRENATAL VIRTUAL VIDEO VISIT ENCOUNTER NOTE  Provider location: Center for [facility] [place]   I connected with Stephanie Woods on 07/10/18 at 10:00 AM EDT by MyChart Video Encounter at home and verified that I am speaking with the correct person using two identifiers.   I discussed the limitations, risks, security and privacy concerns of performing an evaluation and management service by telephone and the availability of in person appointments. I also discussed with the patient that there may be a patient responsible charge related to this service. The patient expressed understanding and agreed to proceed. Subjective:  Stephanie Woods is a 28 y.o. G2P0010 at [redacted]w[redacted]d being seen today for ongoing prenatal care.  She is currently monitored for the following issues for this low-risk pregnancy and has Other insomnia; Cigarette nicotine dependence without complication; and Supervision of normal first pregnancy on their problem list.  Patient reports insomnia and feeling overwhelmed by "everything", No SI/HI. When she does not sleep, she feels depressed.  Does not think it is "depression", had this before, and has a therapist for this. Just unable to sleep, feels she is stressed out due to COVID, working at home etc.  No fetal movement yet. Contractions: Not present. Vag. Bleeding: None.   .   The following portions of the patient's history were reviewed and updated as appropriate: allergies, current medications, past family history, past medical history, past social history, past surgical history and problem list.   Objective:   Vitals:   07/10/18 1003  BP: 110/66    General:  Alert, oriented and cooperative. Patient is in no acute distress.  Respiratory: Normal respiratory effort, no problems with respiration noted  Mental Status: Normal mood and affect. Normal behavior. Normal judgment and thought content.  Rest of physical exam deferred due to type of encounter   Imaging: No results found.  Assessment and Plan:  Pregnancy: G2P0010 at [redacted]w[redacted]d 1. Other insomnia Recommended Benadryl as needed.  Also recommended talking to [facility] about presenting concerns; she will follow up with therapist for now.  SI/HI precautions reviewed. Reassured patient that stress during this pandemic is unfortunately widespread, advised to concentrate on things/people that make her happy.   2. Encounter for supervision of normal first pregnancy in second trimester Low risk NIPS, declines AFP screen. Anatomy scan scheduled. No other complaints or concerns.  Routine obstetric precautions reviewed.   I discussed the assessment and treatment plan with the patient. The patient was provided an opportunity to ask questions and all were answered. The patient agreed with the plan and demonstrated an understanding of the instructions. The patient was advised to call back or seek an in-person office evaluation/go to MAU at [facility] for any urgent or concerning symptoms. Please refer to After Visit Summary for other counseling recommendations.    Return in about 5 weeks (around 08/14/2018) for Virtual OB Visit after 3 pm (has1:45 appt with MFM same day).  Future Appointments  Date Time Provider Department Center  08/14/2018  1:45 PM WH-MFC [place] 2 WH-MFCUS MFC-US    Jaynie Collins, MD Center for [facility], [facility]

## 2018-07-11 DIAGNOSIS — O26892 Other specified pregnancy related conditions, second trimester: Secondary | ICD-10-CM

## 2018-07-11 DIAGNOSIS — O26899 Other specified pregnancy related conditions, unspecified trimester: Secondary | ICD-10-CM

## 2018-07-11 DIAGNOSIS — R519 Headache, unspecified: Secondary | ICD-10-CM

## 2018-07-12 MED ORDER — PROMETHAZINE HCL 25 MG PO TABS: 25.0000 mg | ORAL_TABLET | Freq: Four times a day (QID) | ORAL | 2 refills | Status: DC | PRN

## 2018-08-08 ENCOUNTER — Encounter: Payer: Self-pay | Admitting: *Deleted

## 2018-08-14 ENCOUNTER — Other Ambulatory Visit: Payer: Self-pay

## 2018-08-14 ENCOUNTER — Ambulatory Visit (HOSPITAL_COMMUNITY)
Admission: RE | Admit: 2018-08-14 | Discharge: 2018-08-14 | Disposition: A | Discharge status: Home / Self Care | Payer: BC Managed Care – PPO | Source: Ambulatory Visit | Attending: Obstetrics and Gynecology | Admitting: Obstetrics and Gynecology

## 2018-08-14 ENCOUNTER — Other Ambulatory Visit (HOSPITAL_COMMUNITY): Payer: Self-pay | Admitting: *Deleted

## 2018-08-14 DIAGNOSIS — Z3A19 19 weeks gestation of pregnancy: Secondary | ICD-10-CM

## 2018-08-14 DIAGNOSIS — Z3401 Encounter for supervision of normal first pregnancy, first trimester: Secondary | ICD-10-CM | POA: Insufficient documentation

## 2018-08-14 DIAGNOSIS — Z363 Encounter for antenatal screening for malformations: Secondary | ICD-10-CM | POA: Diagnosis not present

## 2018-08-14 DIAGNOSIS — Z362 Encounter for other antenatal screening follow-up: Secondary | ICD-10-CM

## 2018-08-29 ENCOUNTER — Ambulatory Visit (INDEPENDENT_AMBULATORY_CARE_PROVIDER_SITE_OTHER): Payer: BC Managed Care – PPO | Admitting: Obstetrics & Gynecology

## 2018-08-29 ENCOUNTER — Other Ambulatory Visit: Payer: Self-pay

## 2018-08-29 DIAGNOSIS — Z3402 Encounter for supervision of normal first pregnancy, second trimester: Secondary | ICD-10-CM

## 2018-08-29 DIAGNOSIS — Z3A21 21 weeks gestation of pregnancy: Secondary | ICD-10-CM

## 2018-08-29 NOTE — Progress Notes (Signed)
   PRENATAL VISIT NOTE  Subjective:  Stephanie Woods is a 28 y.o. G2P0010 at [redacted]w[redacted]d being seen today for ongoing prenatal care.  She is currently monitored for the following issues for this low-risk pregnancy and has Other insomnia; Cigarette nicotine dependence without complication; and Supervision of normal first pregnancy on their problem list.  Patient reports no complaints.  Contractions: Not present. Vag. Bleeding: None.   . Denies leaking of fluid.   The following portions of the patient's history were reviewed and updated as appropriate: allergies, current medications, past family history, past medical history, past social history, past surgical history and problem list.   Objective:   Vitals:   08/29/18 1050  BP: 100/62  Pulse: 98  Weight: 189 lb (85.7 kg)    Fetal Status: Fetal Heart Rate (bpm): 149 Fundal Height: 22 cm       General:  Alert, oriented and cooperative. Patient is in no acute distress.  Skin: Skin is warm and dry. No rash noted.   Cardiovascular: Normal heart rate noted  Respiratory: Normal respiratory effort, no problems with respiration noted  Abdomen: Soft, gravid, appropriate for gestational age.  Pain/Pressure: Present     Pelvic: Cervical exam deferred        Extremities: Normal range of motion.  Edema: Trace  Mental Status: Normal mood and affect. Normal behavior. Normal judgment and thought content.   Assessment and Plan:  Pregnancy: G2P0010 at [redacted]w[redacted]d 1. Encounter for supervision of normal first pregnancy in second trimester Doing well, [place] and dating reviewed  Preterm labor symptoms and general obstetric precautions including but not limited to vaginal bleeding, contractions, leaking of fluid and fetal movement were reviewed in detail with the patient. Please refer to After Visit Summary for other counseling recommendations.   Return in about 4 weeks (around 09/26/2018) for virtual.  Future Appointments  Date Time Provider [place]   09/11/2018  3:00 PM [facility]-US  09/11/2018  3:00 PM [place] 3 WH-MFCUS MFC-US  09/26/2018 11:00 AM Chancy Milroy, MD [facility] None    Emeterio Reeve, MD

## 2018-08-29 NOTE — Patient Instructions (Signed)

## 2018-09-11 ENCOUNTER — Ambulatory Visit (HOSPITAL_COMMUNITY)
Admission: RE | Admit: 2018-09-11 | Discharge: 2018-09-11 | Disposition: A | Discharge status: Home / Self Care | Payer: BC Managed Care – PPO | Source: Ambulatory Visit | Attending: Obstetrics and Gynecology | Admitting: Obstetrics and Gynecology

## 2018-09-11 ENCOUNTER — Other Ambulatory Visit (HOSPITAL_COMMUNITY): Payer: Self-pay | Admitting: Obstetrics and Gynecology

## 2018-09-11 ENCOUNTER — Other Ambulatory Visit: Payer: Self-pay

## 2018-09-11 ENCOUNTER — Encounter (HOSPITAL_COMMUNITY): Payer: Self-pay | Admitting: *Deleted

## 2018-09-11 ENCOUNTER — Ambulatory Visit (HOSPITAL_COMMUNITY): Payer: BC Managed Care – PPO | Admitting: *Deleted

## 2018-09-11 ENCOUNTER — Other Ambulatory Visit (HOSPITAL_COMMUNITY): Payer: Self-pay | Admitting: *Deleted

## 2018-09-11 DIAGNOSIS — O26872 Cervical shortening, second trimester: Secondary | ICD-10-CM | POA: Diagnosis not present

## 2018-09-11 DIAGNOSIS — Z3402 Encounter for supervision of normal first pregnancy, second trimester: Secondary | ICD-10-CM | POA: Insufficient documentation

## 2018-09-11 DIAGNOSIS — Z362 Encounter for other antenatal screening follow-up: Secondary | ICD-10-CM | POA: Diagnosis not present

## 2018-09-11 DIAGNOSIS — Z3686 Encounter for antenatal screening for cervical length: Secondary | ICD-10-CM

## 2018-09-11 DIAGNOSIS — Z3A23 23 weeks gestation of pregnancy: Secondary | ICD-10-CM | POA: Diagnosis not present

## 2018-09-11 DIAGNOSIS — O3430 Maternal care for cervical incompetence, unspecified trimester: Secondary | ICD-10-CM

## 2018-09-11 MED ORDER — PROGESTERONE MICRONIZED 200 MG PO CAPS: 200.0000 mg | ORAL_CAPSULE | Freq: Every day | ORAL | 3 refills | Status: DC

## 2018-09-26 ENCOUNTER — Encounter: Payer: Self-pay | Admitting: Obstetrics and Gynecology

## 2018-09-26 ENCOUNTER — Telehealth (INDEPENDENT_AMBULATORY_CARE_PROVIDER_SITE_OTHER): Payer: BC Managed Care – PPO | Admitting: Obstetrics and Gynecology

## 2018-09-26 DIAGNOSIS — Z3A25 25 weeks gestation of pregnancy: Secondary | ICD-10-CM

## 2018-09-26 DIAGNOSIS — Z3402 Encounter for supervision of normal first pregnancy, second trimester: Secondary | ICD-10-CM

## 2018-09-26 DIAGNOSIS — O26879 Cervical shortening, unspecified trimester: Secondary | ICD-10-CM

## 2018-09-26 NOTE — Progress Notes (Signed)
S/w pt to prepare for virtual visit, pt reports fetal movement with some pressure.

## 2018-09-26 NOTE — Progress Notes (Signed)
TELEHEALTH OBSTETRICS PRENATAL VIRTUAL VIDEO VISIT ENCOUNTER NOTE  Provider location: Center for [facility] [place]   I connected with Stephanie Woods on 09/26/18 at 11:00 AM EDT by MyChart Video Encounter at home and verified that I am speaking with the correct person using two identifiers.   I discussed the limitations, risks, security and privacy concerns of performing an evaluation and management service virtually and the availability of in person appointments. I also discussed with the patient that there may be a patient responsible charge related to this service. The patient expressed understanding and agreed to proceed. Subjective:  Stephanie Woods is a 28 y.o. G2P0010 at [redacted]w[redacted]d being seen today for ongoing prenatal care.  She is currently monitored for the following issues for this high-risk pregnancy and has Other insomnia; Cigarette nicotine dependence without complication; Supervision of normal first pregnancy; and Short cervix affecting pregnancy on their problem list.  Patient reports heartburn.  Contractions: Not present. Vag. Bleeding: None.  Movement: Present. Denies any leaking of fluid.   The following portions of the patient's history were reviewed and updated as appropriate: allergies, current medications, past family history, past medical history, past social history, past surgical history and problem list.   Objective:   Vitals:   09/26/18 1105  BP: 116/74  Pulse: 97    Fetal Status:     Movement: Present     General:  Alert, oriented and cooperative. Patient is in no acute distress.  Respiratory: Normal respiratory effort, no problems with respiration noted  Mental Status: Normal mood and affect. Normal behavior. Normal judgment and thought content.  Rest of physical exam deferred due to type of encounter  Imaging: [place] Mfm Ob Transvaginal  Result Date: 09/11/2018 ----------------------------------------------------------------------  OBSTETRICS REPORT                        (Signed Final 09/11/2018 05:11 pm) ---------------------------------------------------------------------- Patient Info  ID #:       [id number]                          D.O.B.:  1990/12/30 (28 yrs)  Name:       Stephanie Woods                  Visit Date: 09/11/2018 04:31 pm ---------------------------------------------------------------------- Performed By  Performed By:     Eden Lathe BS      Ref. Address:     Faculty                    RDMS RVT  Attending:        Noralee Space MD        [facility]  Referred By:      Catalina Antigua MD ---------------------------------------------------------------------- Orders   #  Description  Code         Ordered By   1  [place] MFM OB FOLLOW UP                  [id number]   2  [place] MFM OB TRANSVAGINAL               [id number]      [facility]  ----------------------------------------------------------------------   #  Order #                    Accession #                 Episode #   1  [id number]                  [id number]                  [id number]   2  [id number]                  [id number]                  [id number]  ---------------------------------------------------------------------- Indications   Antenatal follow-up for nonvisualized fetal    Z36.2   anatomy   Encounter for cervical length                  Z36.86   Cervical shortening complicating pregnancy     O26.879   [redacted] weeks gestation of pregnancy                Z3A.23  ---------------------------------------------------------------------- Vital Signs                                                 Height:        5'1" ---------------------------------------------------------------------- Fetal Evaluation  Num Of Fetuses:         1  Fetal Heart Rate(bpm):  145  Cardiac Activity:       Observed  Presentation:           Cephalic  Placenta:                Posterior  P. Cord Insertion:      Previously Visualized  Amniotic Fluid  AFI FV:      Within normal limits                              Largest Pocket(cm)                              6.8 ---------------------------------------------------------------------- Biometry  BPD:      54.8  mm     G. Age:  22w 5d         23  %    CI:        74.77   %    70 - 86                                                          FL/HC:  18.2   %    19.2 - 20.8  HC:      201.1  mm     G. Age:  22w 2d          6  %    HC/AC:      1.06        1.05 - 1.21  AC:      190.4  mm     G. Age:  23w 5d         58  %    FL/BPD:     66.6   %    71 - 87  FL:       36.5  mm     G. Age:  21w 4d          4  %    FL/AC:      19.2   %    20 - 24  HUM:      34.2  mm     G. Age:  21w 5d          8  %  Est. FW:     532  gm      1 lb 3 oz     20  % ---------------------------------------------------------------------- OB History  Gravidity:    2  TOP:          1        Living:  0 ---------------------------------------------------------------------- Gestational Age  LMP:           23w 2d        Date:  04/01/18                 EDD:   01/06/19  U/S Today:     22w 4d                                        EDD:   01/11/19  Best:          23w 2d     Det. By:  LMP  (04/01/18)          EDD:   01/06/19 ---------------------------------------------------------------------- Anatomy  Cranium:               Appears normal         LVOT:                   Appears normal  Cavum:                 Appears normal         Aortic Arch:            Appears normal  Ventricles:            Appears normal         Ductal Arch:            Not well visualized  Choroid Plexus:        Appears normal         Diaphragm:              Appears normal  Cerebellum:            Appears normal         Stomach:                Appears normal, left  sided  Posterior Fossa:       Appears normal         Abdomen:                 Appears normal  Nuchal Fold:           Previously seen        Abdominal Wall:         Appears nml (cord                                                                        insert, abd wall)  Face:                  Appears normal         Cord Vessels:           Appears normal (3                         (orbits and profile)                           vessel cord)  Lips:                  Appears normal         Kidneys:                Appear normal  Palate:                Not well visualized    Bladder:                Appears normal  Thoracic:              Appears normal         Spine:                  Previously seen  Heart:                 Appears normal         Upper Extremities:      Previously seen                         (4CH, axis, and                         situs)  RVOT:                  Appears normal         Lower Extremities:      Previously seen  Other:  Heels visualized. Nasal bone visualized. Technically difficult due to          fetal position. ---------------------------------------------------------------------- Cervix Uterus Adnexa  Cervix  Length:            2.3  cm.  Measured transvaginally.  Uterus  No abnormality visualized.  Left Ovary  Not visualized.  Right Ovary  Not visualized.  Cul De Sac  No free fluid seen.  Adnexa  No abnormality visualized. ---------------------------------------------------------------------- Impression  Patient returned for completion of fetal anatomy. Fetal growth  is appropriate for gestational age. Amniotic fluid is normal  and good fetal activity is seen. Fetal anatomical survey was  completed and appears normal.  Because of the appearance of short cervix on transabdominal  scan, we performed transvaginal ultrasound to evaluate the  cervix. The shortest cervical length measurement is 2.3 cm.  No further shortening was seen on transfundal pressure.  Amniotic fluid sludge was seen at the internal os.  Patient does not have symptoms of uterine contractions or   vaginal bleeding.  Cervical shortening: Studies have shown that in women with  no history of preterm birth and a current short cervical length,  treatment vaginal progesterone (micronized progesterone  200 milligrams daily) leads to a significant reduction of the  risk of spontaneous preterm birth before 34 weeks.  Alternative treatment would be cervical cerclage.  However,  cerclage in women without a history of preterm birth and  short cervix has not shown a statistical reduction in preterm  delivery rates. My recommendation would be vaginal  progesterone.  I informed her that cerclage may be a better option if she is  likely to be noncompliant with vaginal progesterone. Patient  understands the importance of daily administration of vaginal  progesterone and informed me that she is usually very  compliant with medications.  I prescribed vaginal prometrium 200 mg to be taken daily up  to 36 weeks. ---------------------------------------------------------------------- Recommendations  An appointment was made for her to return in 6 weeks for  fetal growth assessment. ----------------------------------------------------------------------                  Noralee Spaceavi Shankar, MD Electronically Signed Final Report   09/11/2018 05:11 pm ----------------------------------------------------------------------  [place] Mfm Ob Follow Up  Result Date: 09/11/2018 ----------------------------------------------------------------------  OBSTETRICS REPORT                       (Signed Final 09/11/2018 05:11 pm) ---------------------------------------------------------------------- Patient Info  ID #:       [id number]                          D.O.B.:  [birth year] (27 yrs)  Name:       Stephanie MaduroORTNEY Ferdig                  Visit Date: 09/11/2018 04:31 pm ---------------------------------------------------------------------- Performed By  Performed By:     Eden Lathearrie Stalter BS      Ref. Address:     Faculty                    RDMS RVT  Attending:         Noralee Spaceavi Shankar MD        [facility]  Referred By:      Catalina AntiguaPEGGY                    CONSTANT MD ---------------------------------------------------------------------- Orders   #  Description  Code         Ordered By   1  [place] MFM OB FOLLOW UP                  [id number]   2  [place] MFM OB TRANSVAGINAL               [id number]      [facility]  ----------------------------------------------------------------------   #  Order #                    Accession #                 Episode #   1  [id number]                  [id number]                  [id number]   2  [id number]                  [id number]                  [id number]  ---------------------------------------------------------------------- Indications   Antenatal follow-up for nonvisualized fetal    Z36.2   anatomy   Encounter for cervical length                  Z36.86   Cervical shortening complicating pregnancy     O26.879   [redacted] weeks gestation of pregnancy                Z3A.23  ---------------------------------------------------------------------- Vital Signs                                                 Height:        5'1" ---------------------------------------------------------------------- Fetal Evaluation  Num Of Fetuses:         1  Fetal Heart Rate(bpm):  145  Cardiac Activity:       Observed  Presentation:           Cephalic  Placenta:               Posterior  P. Cord Insertion:      Previously Visualized  Amniotic Fluid  AFI FV:      Within normal limits                              Largest Pocket(cm)                              6.8 ---------------------------------------------------------------------- Biometry  BPD:      54.8  mm     G. Age:  22w 5d         23  %    CI:        74.77   %    70 - 86                                                          FL/HC:  18.2   %    19.2 - 20.8  HC:      201.1  mm     G. Age:  22w 2d           6  %    HC/AC:      1.06        1.05 - 1.21  AC:      190.4  mm     G. Age:  23w 5d         58  %    FL/BPD:     66.6   %    71 - 87  FL:       36.5  mm     G. Age:  21w 4d          4  %    FL/AC:      19.2   %    20 - 24  HUM:      34.2  mm     G. Age:  21w 5d          8  %  Est. FW:     532  gm      1 lb 3 oz     20  % ---------------------------------------------------------------------- OB History  Gravidity:    2  TOP:          1        Living:  0 ---------------------------------------------------------------------- Gestational Age  LMP:           23w 2d        Date:  04/01/18                 EDD:   01/06/19  U/S Today:     22w 4d                                        EDD:   01/11/19  Best:          23w 2d     Det. By:  LMP  (04/01/18)          EDD:   01/06/19 ---------------------------------------------------------------------- Anatomy  Cranium:               Appears normal         LVOT:                   Appears normal  Cavum:                 Appears normal         Aortic Arch:            Appears normal  Ventricles:            Appears normal         Ductal Arch:            Not well visualized  Choroid Plexus:        Appears normal         Diaphragm:              Appears normal  Cerebellum:            Appears normal         Stomach:                Appears normal, left  sided  Posterior Fossa:       Appears normal         Abdomen:                Appears normal  Nuchal Fold:           Previously seen        Abdominal Wall:         Appears nml (cord                                                                        insert, abd wall)  Face:                  Appears normal         Cord Vessels:           Appears normal (3                         (orbits and profile)                           vessel cord)  Lips:                  Appears normal         Kidneys:                Appear normal  Palate:                Not well visualized     Bladder:                Appears normal  Thoracic:              Appears normal         Spine:                  Previously seen  Heart:                 Appears normal         Upper Extremities:      Previously seen                         (4CH, axis, and                         situs)  RVOT:                  Appears normal         Lower Extremities:      Previously seen  Other:  Heels visualized. Nasal bone visualized. Technically difficult due to          fetal position. ---------------------------------------------------------------------- Cervix Uterus Adnexa  Cervix  Length:            2.3  cm.  Measured transvaginally.  Uterus  No abnormality visualized.  Left Ovary  Not visualized.  Right Ovary  Not visualized.  Cul De Sac  No free fluid seen.  Adnexa  No abnormality visualized. ---------------------------------------------------------------------- Impression  Patient returned for completion of fetal anatomy. Fetal growth  is appropriate for gestational age. Amniotic fluid is normal  and good fetal activity is seen. Fetal anatomical survey was  completed and appears normal.  Because of the appearance of short cervix on transabdominal  scan, we performed transvaginal ultrasound to evaluate the  cervix. The shortest cervical length measurement is 2.3 cm.  No further shortening was seen on transfundal pressure.  Amniotic fluid sludge was seen at the internal os.  Patient does not have symptoms of uterine contractions or  vaginal bleeding.  Cervical shortening: Studies have shown that in women with  no history of preterm birth and a current short cervical length,  treatment vaginal progesterone (micronized progesterone  200 milligrams daily) leads to a significant reduction of the  risk of spontaneous preterm birth before 34 weeks.  Alternative treatment would be cervical cerclage.  However,  cerclage in women without a history of preterm birth and  short cervix has not shown a statistical reduction in preterm   delivery rates. My recommendation would be vaginal  progesterone.  I informed her that cerclage may be a better option if she is  likely to be noncompliant with vaginal progesterone. Patient  understands the importance of daily administration of vaginal  progesterone and informed me that she is usually very  compliant with medications.  I prescribed vaginal prometrium 200 mg to be taken daily up  to 36 weeks. ---------------------------------------------------------------------- Recommendations  An appointment was made for her to return in 6 weeks for  fetal growth assessment. ----------------------------------------------------------------------                  Noralee Space, MD Electronically Signed Final Report   09/11/2018 05:11 pm ----------------------------------------------------------------------   Assessment and Plan:  Pregnancy: G2P0010 at [redacted]w[redacted]d 1. Encounter for supervision of normal first pregnancy in second trimester Stable Glucola next visit Growth scan 10/23/18  2. Short cervix affecting pregnancy Stable on Prometrium  Preterm labor symptoms and general obstetric precautions including but not limited to vaginal bleeding, contractions, leaking of fluid and fetal movement were reviewed in detail with the patient. I discussed the assessment and treatment plan with the patient. The patient was provided an opportunity to ask questions and all were answered. The patient agreed with the plan and demonstrated an understanding of the instructions. The patient was advised to call back or seek an in-person office evaluation/go to [facility]U at [facility] for any urgent or concerning symptoms. Please refer to After Visit Summary for other counseling recommendations.   I provided 10 minutes of face-to-face time during this encounter.  Return in about 3 weeks (around 10/17/2018) for OB visit, face to face for glucola.  Future Appointments  Date Time Provider Department Center   10/23/2018 11:00 AM WH-MFC NURSE WH-MFC MFC-US  10/23/2018 11:00 AM WH-MFC [place] 3 WH-MFCUS MFC-US    Hermina Staggers, MD Center for [facility], [facility]

## 2018-10-09 ENCOUNTER — Other Ambulatory Visit: Payer: Self-pay | Admitting: Obstetrics and Gynecology

## 2018-10-09 MED ORDER — PANTOPRAZOLE SODIUM 40 MG PO TBEC: 40.0000 mg | DELAYED_RELEASE_TABLET | Freq: Every day | ORAL | 4 refills | Status: DC

## 2018-10-17 ENCOUNTER — Ambulatory Visit (INDEPENDENT_AMBULATORY_CARE_PROVIDER_SITE_OTHER): Payer: BC Managed Care – PPO | Admitting: Advanced Practice Midwife

## 2018-10-17 ENCOUNTER — Other Ambulatory Visit: Payer: BC Managed Care – PPO

## 2018-10-17 ENCOUNTER — Other Ambulatory Visit: Payer: Self-pay

## 2018-10-17 VITALS — BP 102/69 | HR 96 | Wt 193.6 lb

## 2018-10-17 DIAGNOSIS — Z3402 Encounter for supervision of normal first pregnancy, second trimester: Secondary | ICD-10-CM

## 2018-10-17 DIAGNOSIS — Z3A28 28 weeks gestation of pregnancy: Secondary | ICD-10-CM

## 2018-10-17 DIAGNOSIS — Z23 Encounter for immunization: Secondary | ICD-10-CM | POA: Diagnosis not present

## 2018-10-17 DIAGNOSIS — O26879 Cervical shortening, unspecified trimester: Secondary | ICD-10-CM

## 2018-10-17 DIAGNOSIS — O26873 Cervical shortening, third trimester: Secondary | ICD-10-CM

## 2018-10-17 DIAGNOSIS — Z3482 Encounter for supervision of other normal pregnancy, second trimester: Secondary | ICD-10-CM

## 2018-10-17 DIAGNOSIS — R21 Rash and other nonspecific skin eruption: Secondary | ICD-10-CM

## 2018-10-17 NOTE — Progress Notes (Signed)
   PRENATAL VISIT NOTE  Subjective:  Stephanie Woods is a 28 y.o. G2P0010 at [redacted]w[redacted]d being seen today for ongoing prenatal care.  She is currently monitored for the following issues for this low-risk pregnancy and has Other insomnia; Cigarette nicotine dependence without complication; Supervision of normal first pregnancy; and Short cervix affecting pregnancy on their problem list.  Patient reports rash of chest, arms, small amount on abdomen, no itching or pain..  Contractions: Not present. Vag. Bleeding: None.  Movement: Present. Denies leaking of fluid.   The following portions of the patient's history were reviewed and updated as appropriate: allergies, current medications, past family history, past medical history, past social history, past surgical history and problem list.   Objective:   Vitals:   10/17/18 0820  BP: 102/69  Pulse: 96  Weight: 193 lb 9.6 oz (87.8 kg)    Fetal Status: Fetal Heart Rate (bpm): 155   Movement: Present     General:  Alert, oriented and cooperative. Patient is in no acute distress.  Skin: Skin is warm and dry. No rash noted.   Cardiovascular: Normal heart rate noted  Respiratory: Normal respiratory effort, no problems with respiration noted  Abdomen: Soft, gravid, appropriate for gestational age.  Pain/Pressure: Absent     Pelvic: Cervical exam deferred        Extremities: Normal range of motion.  Edema: Trace  Mental Status: Normal mood and affect. Normal behavior. Normal judgment and thought content.   Assessment and Plan:  Pregnancy: G2P0010 at [redacted]w[redacted]d   1. Encounter for supervision of normal first pregnancy in second trimester --Anticipatory guidance about next visits/weeks of pregnancy given.  --Reviewed safety, visitor policy, and COVID testing for scheduled IOL or and C/S and for active labor admission.  Reassurance about COVID-19 with young healthy women according to current data. Discussed possible changes to visits, including televisits, that  may occur due to COVID-19.  The office remains open if pt needs to be seen and MAU is open 24 hours/day for OB emergencies.  - Glucose Tolerance, 2 Hours w/1 Hour - CBC - RPR - HIV Antibody (routine testing w rflx) - Tdap vaccine greater than or equal to 7yo IM  2. Short cervix affecting pregnancy --Pt continuing to use vaginal progesterone daily.  Questions answered. No additional measures of cervical length at this gestation.  Pt to report any s/sx of preterm labor, which were reviewed today.  3. Rash and nonspecific skin eruption --Pt has a slightly raised, irregular in shape pigmented rash on her chest, arms, and one localized area of her abdomen. She reports this rash started in early pregnancy. Dermatology prescribed triamcinolone cream which did not help and is expensive.  There is no itching or pain. --Nonspecific, not c/w PUPPP, or any infectious process.  May be contact dermatitis but no known triggers. --May be related to pregnancy hormones and should resolve postpartum. Pt to f   Preterm labor symptoms and general obstetric precautions including but not limited to vaginal bleeding, contractions, leaking of fluid and fetal movement were reviewed in detail with the patient. Please refer to After Visit Summary for other counseling recommendations.   Return in about 4 weeks (around 11/14/2018).  Future Appointments  Date Time Provider [place]  10/23/2018 11:00 AM [facility]  10/23/2018 11:00 AM [facility] [place] 3 [facility] M[facility]-US  11/14/2018 10:30 AM Shelly Bombard, MD [facility] None    Fatima Blank, CNM

## 2018-10-17 NOTE — Patient Instructions (Signed)
AREA PEDIATRIC/FAMILY PRACTICE PHYSICIANS  [place] ([id number]) . [facility] o Chambliss, MD; Eniola, MD; Hale, MD; Hensel, MD; McDiarmid, MD; McIntyer, MD; Neal, MD; Walden, MD o [address] [place], [place] [address] o [phone] o Mon-Fri 8:30-12:30, 1:30-5:00 o Providers come to see babies at [facility] o Accepting Medicaid . [facility] o Limited providers who accept newborns: Koirala, MD; Morrow, MD; Wolters, MD o [address], [place], [place] [address] o [phone] o Mon-Fri 8:00-5:30 o Babies seen by providers at [facility] o Does NOT accept Medicaid o Please call early in hospitalization for appointment (limited availability)  . [facility] o [place], MD o [address] [place], [place] [address] o [phone] o Mon, Tue, Thur, Fri 8:30-5:00, Wed 10:00-7:00 (closed 1-2pm) o Babies seen by [facility] providers o Accepting Medicaid . [facility] - Pediatrician o Rubin, MD o [address], [place], [place] [address] o [phone] o Mon-Fri 8:30-5:00, Sat 8:30-12:00 o Provider comes to see babies at [facility] o Accepting Medicaid o Must have been referred from current patients or contacted office prior to delivery . [facility] o Brown, MD; Chandler, MD; Ettefagh, MD; Grant, MD; Lester, MD; McCormick, MD; McQueen, MD; Prose, MD; Simha, MD; Stanley, MD; Stryffeler, NP; Tebben, NP o [address], [place], [place] [address] o [phone] o Mon, Tue, Thur, Fri 8:30-5:30, Wed 9:30-5:30, Sat 8:30-12:30 o Babies seen by [facility] providers o Accepting Medicaid o Only accepting infants of first-time parents or siblings of current patients o Hospital discharge coordinator will make follow-up appointment . Jack Amos o [address],  [place], [place]  [address] o [phone]   Fax - [phone] . [facility] o [address], [place], [place]  [address] o Phone - [phone]   Fax - [phone] . Shilpa Gosrani o [address], [place], [place]  [address] o [phone]  [place] ([address]) . [facility] o Bates, MD; [place], MD; Cooper, Cox, MD; MD; Davis, MD; Dovico, MD; Ettefaugh, MD; Little, MD; Lowe, MD; Keiffer, MD; Melvin, MD; Sumner, MD; Williams, MD o [address], [place], [place] [address] o [phone] o Mon-Fri 8:30-5:00 (extended evenings Mon-Thur as needed), Sat-Sun 10:00-1:00 o Providers come to see babies at [facility] o Accepting Medicaid for families of first-time babies and families with all children in the household age 3 and under. Must register with office prior to making appointment (M-F only). . [facility] o Henson, NP; Knapp, MD; Lalonde, MD; Tysinger, PA o [address] [place], [place] [address] o [phone] o Mon-Fri 8:00-5:00 o Babies seen by providers at [facility] o Does NOT accept Medicaid/Commercial Insurance Only . [facility] ([facility])  o Artis, MD; Barnes, MD; Bratton, MD; Coccaro, MD; Lockett Gardner, MD; Kramer, MD; Marshall, MD; Netherton, MD; Poleto, MD; Skinner, MD o [address] [place], [place] [address] o [phone] o Mon-Fri 8:30-5:30, Sat (Oct.-Mar.) 9:00-1:00 o Babies seen by providers at [facility] o Accepting Medicaid  [place] ([address]) . [facility] o Reid, MD; Warner, MD o [address], [place],  [address] o [phone] o Mon-Fri 8:30-5:00, Sat 8:30-12:00 o Providers come to see babies at [facility] o Does NOT accept Medicaid . [facility] Ricci Barker, PA; Mannie Stabile, MD; [place], [place]; Nancy Fetter, MD; Moreen Fowler, [place],  [place], [place] [address] o [phone] o Mon-Fri 8:00-5:00 o Babies seen by providers at [facility] o Does NOT accept Medicaid o Only accepting babies of parents who are patients o Please call early in hospitalization for appointment (limited availability) . [facility] Pediatricians Blanca Friend, MD; Sharlene Motts, MD; Rod Can, MD; Warner Mccreedy, NP; Sabra Heck, MD; Ermalinda Memos, MD; Sharlett Iles, NP; Aurther Loft, MD; Jerrye Beavers, MD; Marcello Moores, MD; Berline Lopes, MD; Charolette Forward, MD o [place]. [place], [place], [place] [address] o [phone] o Mon-Fri 8:00-5:00, Sat 9:00-12:00 o Providers come to see babies at [facility] o Does NOT accept [facility] [phone]) . [facility] providers accepting new patients: Dayna Ramus, NP; [place], [place], [place], [place] [address] o [phone] o Mon-Fri 8:00-5:00 o Babies seen by providers at [facility] o Does NOT accept Medicaid o Only accepting babies of parents who are patients o Please call early in hospitalization for appointment (limited availability) . [facility] Oswaldo Conroy, MD; Sheran Lawless, MD o [address] [place], [place] [address] o [phone] (press 1 to schedule appointment) o Mon-Fri 8:00-5:00 o Providers come to see babies at [facility] o Does NOT accept Medicaid . [facility] Jodi Mourning, MD o [address] [place], [place] [address] o [phone] o Mon-Fri 8:30-5:00 (lunch 12:30-1:00), extended hours by appointment only Wed 5:00-6:30 o Babies seen by [facility] providers o Accepting Medicaid . [place] at Evalyn Casco, MD; [place], MD; Ethlyn Gallery, MD o [place], [place], [place] [address] o [phone] o Mon-Fri 8:00-5:00 o Babies seen by [facility] providers o Does NOT accept Medicaid . Therapist, music at [place], MD; Yong Channel, MD; [place], [place] [address] [place], [place] [address] o  [phone] o Mon-Fri 8:00-5:00 o Babies seen by [facility] providers o Does NOT accept Medicaid . [place], [place]; [place], [place]; [place], NP; Albertina Parr, MD; Frederic Jericho, MD; Ronney Lion, MD; Carlos Levering, NP; Jerelene Redden, NP; Tomasita Crumble, NP; Ronelle Nigh, NP; Corinna Lines, MD; [place], MD o [address] [place], [place] [address] o [phone] o Mon-Fri 8:30-5:00, Sat 10:00-1:00 o Providers come to see babies at [facility] o Does NOT accept Medicaid o Free prenatal information session Tuesdays at 4:45pm . [facility] Porfirio Oar, MD; [place], [place]; [place], [place]; [place], [place]., [place] [address] o [phone] o Mon-Fri 7:30-5:30 o Babies seen by [facility] providers . [facility] Children's Doctor o [address], [place], [place], [place]  [address] o [phone]   Fax - [phone]  [place] [phone] & [phone]) . [place], MD o [address] [place], [place] [address] o [phone] o Mon-Thur 8:00-6:00 o Providers come to see babies at [place] Medicaid . [place], NP; Melford Aase, MD; [place], [place]; [place], [address] [place], [place] [address] o [phone] o Mon-Thur 7:30-7:30, Fri 7:30-4:30 o Babies seen by [facility] providers o Accepting Medicaid . [facility] Nyra Jabs, MD; Cristino Martes, NP; Gertie Baron, MD o [place] [address], [place], [place] [address] o [phone] o Mon-Fri 8:30-5:00, Sat 8:30-12:00 o Providers come to see babies at [place] Medicaid o Must have "Meet & Greet" appointment at office prior to delivery . [place] ([place]) o Hubbard,  MD; Juleen China, MD; Clydene Laming, [place] [address], [place], [place] [address] o [phone] o Mon-Wed 8:00-6:00, Thur-Fri 8:00-5:00, Sat 9:00-12:00 o Providers come to see  babies at [facility] o Does NOT accept Medicaid o Only accepting siblings of current patients . [facility], [place], [place], [place]  [address] o [phone]   Fax [phone] . [place] at [place] [address], [place], [place]  [address] o [phone]   Fax - Artois Craig [phone] & [phone]) . Therapist, music at [place], DO; [place], [address] [place], [place] [address] o [phone] o Mon-Fri 7:00-5:00 o Babies seen by [facility] providers o Does NOT accept Medicaid . [place], MD; [place], [place]; [place], [place], [place], [place] [address] o [phone] o Mon-Fri 8:00-5:00 o Babies seen by [facility] providers o Accepting Medicaid . Reed, MD; [place], [place]; [place], NP; [place], [place], [place], [place] [address] o [phone] o Mon-Fri 8:00-5:00 o Babies seen by providers at [facility] [phone]) . [facility] [place], [place] o [address]., [place], [place] [address] o [phone] o Mon-Fri 8:00-5:00 o Babies seen by [facility] providers o Does NOT accept Medicaid o Limited availability, please call early in hospitalization to schedule follow-up . [facility] Leilani Merl, PA; Maisie Fus, MD; [place], MD; [place], [place]; Jeannine Kitten, MD; [place], [place]y [facility] [address], [place], [place] [address] o [phone] o Mon-Fri 8:30-5:00, Sat 9:00-12:00 o Babies seen by providers at [facility] o Accepting Medicaid o Please register online then schedule online or call office o [email or web address] . [place] ([place] [facility] [place]) Kristian Covey, NP; Dwyane Dee, MD; Leonidas Romberg, PA o [address] [place], [place], [place] [address] o [phone] o Mon-Fri 8:00-5:00 o Babies seen by providers at [facility] o Accepting Medicaid . [place] ([facility]) Dairl Ponder, MD; Rayvon Char, NP; Melina Modena, MD o [address] [place], [place], [place] [address] o [phone] o Mon-Fri 8:00-5:30, Sat&Sun by appointment (phones open at 8:30) o Babies seen by [facility] providers o Accepting Medicaid o Must be a first-time baby or sibling of current patient . [place], [address], [place], [place]  [address] o [phone]   Fax - [phone]  [place] [phone] & [phone]) . [place], [place]; [place], [place]; Benjamine Mola, MD; [place], [place]; Harrell Lark, MD o [address], [place], [place] [address] o [phone] o Mon-Thur 8:00-7:00, Fri 8:00-5:00, Sat 8:00-12:00, Sun 9:00-12:00 o Babies seen by [facility] providers o Accepting Medicaid . [facility], MD; Ruthann Cancer, MD; [facility], MD o [address], [place],  [address] o [phone] o Mon-Thur 8:00-5:00 o Babies seen by providers at [facility] o Accepting Medicaid . [facility] [place], MD; Coe-Goins, MD; Amedeo Plenty, MD; Bobby Rumpf, MD; List, MD; Lavonia Drafts, MD; Ruthann Cancer, MD; Selinda Eon, MD; Audie Box, MD; Jim Like, MD; Christie Nottingham, MD; [place], MD; [place] Nunnery, MD o [address] [place], [place]  [address] o [phone] o Mon-Fri 8:00-5:30, Sat (Oct.-Mar.) 9:00-1:00 o Babies seen by providers at [facility] o Accepting Medicaid o Must fill out new patient packet, available online at [email or web address] . [place] ([facility]) Barnabas Lister, NP; [place] Kingfisher, NP; Claiborne Billings, NP; Rolla Plate, MD; [place], [place];  Carola Rhine, MD; Tyron Russell, MD; Delia Chimes, NP o [address], [place], [place] [address] o [phone] o Mon-Thur 8:00-5:30, Fri 8:00-5:00 o Babies seen by providers at [place] [phone]) . [place], [place]; [place], MD; Dennard Schaumann, MD; [place], [place] o [address] [place], [place] [address] o [phone] o Mon-Fri 8:00-5:00 o Babies seen by providers at [place] [phone]) . [place] at [place], [place]; Olen Pel, MD; [place], [place], [place], [place] [address] o [phone] o Mon-Fri 8:00-5:00 o Babies seen by providers at [facility] o Does NOT accept Medicaid o Limited appointment availability, please call early in hospitalization  . Therapist, music at [place], [place]; [place], [address], [place], [place] [address] o [phone] o Mon-Fri 8:00-5:00 o Babies seen by [facility] providers o Does NOT accept Medicaid . [facility] Su Grand, MD; Guy Sandifer, MD; [place], [place]; [place], [place] [address], [place], [place] [address] o [phone] o Mon-Fri 8:00-5:00 o After hours clinic [facility] [place], [place] [address]) [phone] Mon-Fri 5:00-8:00, Sat 12:00-6:00, Sun 10:00-4:00 o Babies seen by [facility] providers o Accepting Medicaid . [place] at [facility] o [address], [place], [place]  [address] o [phone]   Fax - [phone]  [place] [phone]) . Therapist, music at [facility], MD o [address] [place], [place], [place] [address] o [phone] o Mon-Fri 8:00-5:00 o Babies seen by [facility] providers o Does NOT accept Medicaid . [place] ([place] [facility] [place]) Bing Neighbors, MD o [address] [place], [place], [place] [address] o [phone] o  Mon-Thur 8:00-7:00, Fri 8:00-5:00, Sat 8:00-12:00 o Babies seen by providers at [facility] o Accepting Medicaid - but does not have vaccinations in office (must be received elsewhere) o Limited availability, please call early in hospitalization  [place] ([address]) . [place], MD o [address] [place] [address] o [phone]  Fax [phone]   Contact Dermatitis Dermatitis is redness, soreness, and swelling (inflammation) of the skin. Contact dermatitis is a reaction to certain substances that touch the skin. Many different substances can cause contact dermatitis. There are two types of contact dermatitis:  Irritant contact dermatitis. This type is caused by something that irritates your skin, such as having dry hands from washing them too often with soap. This type does not require previous exposure to the substance for a reaction to occur. This is the most common type.  Allergic contact dermatitis. This type is caused by a substance that you are allergic to, such as poison ivy. This type occurs when you have been exposed to the substance (allergen) and develop a sensitivity to it. Dermatitis may develop soon after your first exposure to the allergen, or it may not develop until the next time you are exposed and every time thereafter. What are the causes? Irritant contact dermatitis is most commonly caused  by exposure to:  Makeup.  Soaps.  Detergents.  Bleaches.  Acids.  Metal salts, such as nickel. Allergic contact dermatitis is most commonly caused by exposure to:  Poisonous plants.  Chemicals.  Jewelry.  Latex.  Medicines.  Preservatives in products, such as clothing. What increases the risk? You are more likely to develop this condition if you have:  A job that exposes you to irritants or allergens.  Certain medical conditions, such as asthma or eczema. What are the signs or symptoms? Symptoms of this  condition may occur on your body anywhere the irritant has touched you or is touched by you.  Symptoms include: ? Dryness or flaking. ? Redness. ? Cracks. ? Itching. ? Pain or a burning feeling. ? Blisters. ? Drainage of small amounts of blood or clear fluid from skin cracks. With allergic contact dermatitis, there may also be swelling in areas such as the eyelids, mouth, or genitals. How is this diagnosed? This condition is diagnosed with a medical history and physical exam.  A patch skin test may be performed to help determine the cause.  If the condition is related to your job, you may need to see an occupational medicine specialist. How is this treated? This condition is treated by checking for the cause of the reaction and protecting your skin from further contact. Treatment may also include:  Steroid creams or ointments. Oral steroid medicines may be needed in more severe cases.  Antibiotic medicines or antibacterial ointments, if a skin infection is present.  Antihistamine lotion or an antihistamine taken by mouth to ease itching.  A bandage (dressing). Follow these instructions at home: Skin care  Moisturize your skin as needed.  Apply cool compresses to the affected areas.  Try applying baking soda paste to your skin. Stir water into baking soda until it reaches a paste-like consistency.  Do not scratch your skin, and avoid friction to the affected area.  Avoid the use of soaps, perfumes, and dyes. Medicines  Take or apply over-the-counter and prescription medicines only as told by your health care provider.  If you were prescribed an antibiotic medicine, take or apply the antibiotic as told by your health care provider. Do not stop using the antibiotic even if your condition improves. Bathing  Try taking a bath with: ? Epsom salts. Follow the instructions on the packaging. You can get these at your local pharmacy or grocery store. ? Baking soda. Pour a small  amount into the bath as directed by your health care provider. ? Colloidal oatmeal. Follow the instructions on the packaging. You can get this at your local pharmacy or grocery store.  Bathe less frequently, such as every other day.  Bathe in lukewarm water. Avoid using hot water. Bandage care  If you were given a bandage (dressing), change it as told by your health care provider.  Wash your hands with soap and water before and after you change your dressing. If soap and water are not available, use hand sanitizer. General instructions  Avoid the substance that caused your reaction. If you do not know what caused it, keep a journal to try to track what caused it. Write down: ? What you eat. ? What cosmetic products you use. ? What you drink. ? What you wear in the affected area. This includes jewelry.  More redness, swelling, or pain.  More fluid or blood.  Warmth.  Pus or a bad smell.  Keep all follow-up visits as told by your health care provider.  This is important. Contact a health care provider if:  Your condition does not improve with treatment.  Your condition gets worse.  You have signs of infection such as swelling, tenderness, redness, soreness, or warmth in the affected area.  You have a fever.  You have new symptoms. Get help right away if:  You have a severe headache, neck pain, or neck stiffness.  You vomit.  You feel very sleepy.  You notice red streaks coming from the affected area.  Your bone or joint underneath the affected area becomes painful after the skin has healed.  The affected area turns darker.  You have difficulty breathing. Summary  Dermatitis is redness, soreness, and swelling (inflammation) of the skin. Contact dermatitis is a reaction to certain substances that touch the skin.  Symptoms of this condition may occur on your body anywhere the irritant has touched you or is touched by you.  This condition is treated by figuring  out what caused the reaction and protecting your skin from further contact. Treatment may also include medicines and skin care.  Avoid the substance that caused your reaction. If you do not know what caused it, keep a journal to try to track what caused it.  Contact a health care provider if your condition gets worse or you have signs of infection such as swelling, tenderness, redness, soreness, or warmth in the affected area. This information is not intended to replace advice given to you by your health care provider. Make sure you discuss any questions you have with your health care provider. Document Released: 02/06/2000 Document Revised: 05/31/2018 Document Reviewed: 08/24/2017 Elsevier Patient Education  2020 [employer].

## 2018-10-17 NOTE — Progress Notes (Signed)
Pt is here for ROB and 2 hr GTT. [redacted]w[redacted]d.  

## 2018-10-18 ENCOUNTER — Telehealth: Payer: Self-pay

## 2018-10-18 LAB — HIV ANTIBODY (ROUTINE TESTING W REFLEX): HIV Screen 4th Generation wRfx: NONREACTIVE

## 2018-10-18 LAB — CBC
Hematocrit: 34.4 % (ref 34.0–46.6)
Hemoglobin: 10.9 g/dL — ABNORMAL LOW (ref 11.1–15.9)
MCH: 26.4 pg — ABNORMAL LOW (ref 26.6–33.0)
MCHC: 31.7 g/dL (ref 31.5–35.7)
MCV: 83 fL (ref 79–97)
Platelets: 178 10*3/uL (ref 150–450)
RBC: 4.13 x10E6/uL (ref 3.77–5.28)
RDW: 12.9 % (ref 11.7–15.4)
WBC: 8.1 10*3/uL (ref 3.4–10.8)

## 2018-10-18 LAB — GLUCOSE TOLERANCE, 2 HOURS W/ 1HR
Glucose, 1 hour: 121 mg/dL (ref 65–179)
Glucose, 2 hour: 121 mg/dL (ref 65–152)
Glucose, Fasting: 90 mg/dL (ref 65–91)

## 2018-10-18 LAB — RPR: RPR Ser Ql: NONREACTIVE

## 2018-10-18 NOTE — Telephone Encounter (Signed)
Left VM message to call Office; need info to complete FMLA Paperwork.

## 2018-10-23 ENCOUNTER — Ambulatory Visit (HOSPITAL_COMMUNITY)
Admission: RE | Admit: 2018-10-23 | Discharge: 2018-10-23 | Disposition: A | Discharge status: Home / Self Care | Payer: BC Managed Care – PPO | Source: Ambulatory Visit | Attending: Obstetrics and Gynecology | Admitting: Obstetrics and Gynecology

## 2018-10-23 ENCOUNTER — Ambulatory Visit (HOSPITAL_COMMUNITY): Payer: BC Managed Care – PPO | Admitting: *Deleted

## 2018-10-23 ENCOUNTER — Other Ambulatory Visit (HOSPITAL_COMMUNITY): Payer: Self-pay | Admitting: Obstetrics and Gynecology

## 2018-10-23 ENCOUNTER — Encounter (HOSPITAL_COMMUNITY): Payer: Self-pay

## 2018-10-23 ENCOUNTER — Other Ambulatory Visit (HOSPITAL_COMMUNITY): Payer: Self-pay | Admitting: *Deleted

## 2018-10-23 ENCOUNTER — Other Ambulatory Visit: Payer: Self-pay

## 2018-10-23 VITALS — BP 112/67 | HR 103 | Temp 98.5°F

## 2018-10-23 DIAGNOSIS — O3430 Maternal care for cervical incompetence, unspecified trimester: Secondary | ICD-10-CM

## 2018-10-23 DIAGNOSIS — O26879 Cervical shortening, unspecified trimester: Secondary | ICD-10-CM

## 2018-10-23 DIAGNOSIS — O26873 Cervical shortening, third trimester: Secondary | ICD-10-CM

## 2018-10-23 DIAGNOSIS — Z3403 Encounter for supervision of normal first pregnancy, third trimester: Secondary | ICD-10-CM | POA: Insufficient documentation

## 2018-10-23 DIAGNOSIS — Z3A29 29 weeks gestation of pregnancy: Secondary | ICD-10-CM

## 2018-10-23 DIAGNOSIS — O99213 Obesity complicating pregnancy, third trimester: Secondary | ICD-10-CM

## 2018-10-23 DIAGNOSIS — O365930 Maternal care for other known or suspected poor fetal growth, third trimester, not applicable or unspecified: Secondary | ICD-10-CM | POA: Diagnosis not present

## 2018-10-23 DIAGNOSIS — Z362 Encounter for other antenatal screening follow-up: Secondary | ICD-10-CM

## 2018-10-23 DIAGNOSIS — IMO0002 Reserved for concepts with insufficient information to code with codable children: Secondary | ICD-10-CM

## 2018-11-03 ENCOUNTER — Encounter (HOSPITAL_COMMUNITY): Payer: Self-pay

## 2018-11-03 ENCOUNTER — Ambulatory Visit (HOSPITAL_COMMUNITY): Payer: BC Managed Care – PPO | Admitting: *Deleted

## 2018-11-03 ENCOUNTER — Other Ambulatory Visit: Payer: Self-pay

## 2018-11-03 ENCOUNTER — Ambulatory Visit (HOSPITAL_COMMUNITY)
Admission: RE | Admit: 2018-11-03 | Discharge: 2018-11-03 | Disposition: A | Discharge status: Home / Self Care | Payer: BC Managed Care – PPO | Source: Ambulatory Visit | Attending: Obstetrics and Gynecology | Admitting: Obstetrics and Gynecology

## 2018-11-03 VITALS — BP 112/69 | HR 95 | Temp 97.6°F

## 2018-11-03 DIAGNOSIS — Z3403 Encounter for supervision of normal first pregnancy, third trimester: Secondary | ICD-10-CM

## 2018-11-03 DIAGNOSIS — O365990 Maternal care for other known or suspected poor fetal growth, unspecified trimester, not applicable or unspecified: Secondary | ICD-10-CM | POA: Diagnosis not present

## 2018-11-03 DIAGNOSIS — Z3A30 30 weeks gestation of pregnancy: Secondary | ICD-10-CM | POA: Diagnosis not present

## 2018-11-03 DIAGNOSIS — IMO0002 Reserved for concepts with insufficient information to code with codable children: Secondary | ICD-10-CM

## 2018-11-03 DIAGNOSIS — O365930 Maternal care for other known or suspected poor fetal growth, third trimester, not applicable or unspecified: Secondary | ICD-10-CM

## 2018-11-03 DIAGNOSIS — O26873 Cervical shortening, third trimester: Secondary | ICD-10-CM

## 2018-11-03 DIAGNOSIS — O99213 Obesity complicating pregnancy, third trimester: Secondary | ICD-10-CM | POA: Diagnosis not present

## 2018-11-10 ENCOUNTER — Ambulatory Visit (HOSPITAL_COMMUNITY)
Admission: RE | Admit: 2018-11-10 | Discharge: 2018-11-10 | Disposition: A | Discharge status: Home / Self Care | Payer: BC Managed Care – PPO | Source: Ambulatory Visit | Attending: Obstetrics and Gynecology | Admitting: Obstetrics and Gynecology

## 2018-11-10 ENCOUNTER — Ambulatory Visit (HOSPITAL_COMMUNITY): Payer: BC Managed Care – PPO | Admitting: *Deleted

## 2018-11-10 ENCOUNTER — Encounter (HOSPITAL_COMMUNITY): Payer: Self-pay

## 2018-11-10 ENCOUNTER — Other Ambulatory Visit (HOSPITAL_COMMUNITY): Payer: Self-pay | Admitting: Obstetrics and Gynecology

## 2018-11-10 ENCOUNTER — Other Ambulatory Visit (HOSPITAL_COMMUNITY): Payer: Self-pay | Admitting: *Deleted

## 2018-11-10 ENCOUNTER — Other Ambulatory Visit: Payer: Self-pay

## 2018-11-10 DIAGNOSIS — Z3A31 31 weeks gestation of pregnancy: Secondary | ICD-10-CM | POA: Diagnosis not present

## 2018-11-10 DIAGNOSIS — O99213 Obesity complicating pregnancy, third trimester: Secondary | ICD-10-CM

## 2018-11-10 DIAGNOSIS — IMO0002 Reserved for concepts with insufficient information to code with codable children: Secondary | ICD-10-CM

## 2018-11-10 DIAGNOSIS — O26879 Cervical shortening, unspecified trimester: Secondary | ICD-10-CM | POA: Diagnosis not present

## 2018-11-10 DIAGNOSIS — Z3403 Encounter for supervision of normal first pregnancy, third trimester: Secondary | ICD-10-CM | POA: Diagnosis not present

## 2018-11-10 DIAGNOSIS — O365930 Maternal care for other known or suspected poor fetal growth, third trimester, not applicable or unspecified: Secondary | ICD-10-CM

## 2018-11-10 DIAGNOSIS — O26873 Cervical shortening, third trimester: Secondary | ICD-10-CM

## 2018-11-14 ENCOUNTER — Telehealth (INDEPENDENT_AMBULATORY_CARE_PROVIDER_SITE_OTHER): Payer: BC Managed Care – PPO | Admitting: Obstetrics

## 2018-11-14 ENCOUNTER — Encounter: Payer: Self-pay | Admitting: Obstetrics

## 2018-11-14 VITALS — BP 108/74 | HR 108

## 2018-11-14 DIAGNOSIS — O26893 Other specified pregnancy related conditions, third trimester: Secondary | ICD-10-CM

## 2018-11-14 DIAGNOSIS — O26879 Cervical shortening, unspecified trimester: Secondary | ICD-10-CM

## 2018-11-14 DIAGNOSIS — R21 Rash and other nonspecific skin eruption: Secondary | ICD-10-CM

## 2018-11-14 DIAGNOSIS — Z3A32 32 weeks gestation of pregnancy: Secondary | ICD-10-CM

## 2018-11-14 DIAGNOSIS — Z3403 Encounter for supervision of normal first pregnancy, third trimester: Secondary | ICD-10-CM

## 2018-11-14 DIAGNOSIS — O26873 Cervical shortening, third trimester: Secondary | ICD-10-CM

## 2018-11-14 NOTE — Progress Notes (Signed)
Pt states her [id number] are [id number] bit.   No other concerns [email or web address]

## 2018-11-14 NOTE — Progress Notes (Signed)
TELEHEALTH OBSTETRICS PRENATAL VIRTUAL VIDEO VISIT ENCOUNTER NOTE  Provider location: [facility] [place]   I connected with Stephanie Woods on 11/14/18 at 10:30 AM EDT by OB MyChart Video Encounter at home and verified that I am speaking with the correct person using two identifiers.   I discussed the limitations, risks, security and privacy concerns of performing an evaluation and management service virtually and the availability of in person appointments. I also discussed with the patient that there may be a patient responsible charge related to this service. The patient expressed understanding and agreed to proceed. Subjective:  Stephanie Woods is a 28 y.o. G2P0010 at [redacted]w[redacted]d being seen today for ongoing prenatal care.  She is currently monitored for the following issues for this low-risk pregnancy and has Other insomnia; Cigarette nicotine dependence without complication; Supervision of normal first pregnancy; and Short cervix affecting pregnancy on their problem list.  Patient reports no complaints.  Contractions: Not present. Vag. Bleeding: None.  Movement: Present. Denies any leaking of fluid.   The following portions of the patient's history were reviewed and updated as appropriate: allergies, current medications, past family history, past medical history, past social history, past surgical history and problem list.   Objective:   Vitals:   11/14/18 1045  BP: 108/74  Pulse: (!) 108    Fetal Status:     Movement: Present     General:  Alert, oriented and cooperative. Patient is in no acute distress.  Respiratory: Normal respiratory effort, no problems with respiration noted  Mental Status: Normal mood and affect. Normal behavior. Normal judgment and thought content.  Rest of physical exam deferred due to type of encounter  Imaging: [place] Mfm Fetal Bpp Wo Non Stress  Result Date: 11/10/2018 ----------------------------------------------------------------------   OBSTETRICS REPORT                       (Signed Final 11/10/2018 05:03 pm) ---------------------------------------------------------------------- Patient Info  ID #:       [id number]                          D.O.B.:  [birth year] (28 yrs)  Name:       Stephanie Woods                  Visit Date: 11/10/2018 01:45 pm ---------------------------------------------------------------------- Performed By  Performed By:     Eden Lathe BS      Ref. Address:     Faculty                    RDMS RVT  Attending:        Ma Rings MD         [facility]  Referred By:      Catalina Antigua MD ---------------------------------------------------------------------- Orders   #  Description  Code         Ordered By   1  [place] MFM UA CORD DOPPLER               [id number]     RAVI SHANKAR   2  [place] MFM FETAL BPP WO NON              76819.01     RAVI [facility]      STRESS  ----------------------------------------------------------------------   #  Order #                    Accession #                 Episode #   1  [id number]                  [id number]                  [id number]   2  [id number]                  [id number]                  [id number]  ---------------------------------------------------------------------- Indications   Maternal care for known or suspected poor      O36.5930   fetal growth, third trimester, not applicable or   unspecified   Obesity complicating pregnancy, third          O99.213   trimester   Cervical shortening complicating pregnancy     O26.879   Encounter for other antenatal screening        Z36.2   follow-up (low risk NIPS)   [redacted] weeks gestation of pregnancy                Z3A.31  ---------------------------------------------------------------------- Vital Signs                                                 Height:        5'1"  ---------------------------------------------------------------------- Fetal Evaluation  Num Of Fetuses:         1  Cardiac Activity:       Observed  Presentation:           Cephalic  Placenta:               Anterior  Amniotic Fluid  AFI FV:      Within normal limits  AFI Sum(cm)     %Tile       Largest Pocket(cm)  11.61           28          5.18  RUQ(cm)       RLQ(cm)       LUQ(cm)        LLQ(cm)  5.18          2.04          1.48           2.91 ---------------------------------------------------------------------- Biophysical Evaluation  Amniotic F.V:   Within normal limits       F. Tone:        Observed  F. Movement:    Observed                   Score:  8/8  F. Breathing:   Observed ---------------------------------------------------------------------- OB History  Gravidity:    2  TOP:          1        Living:  0 ---------------------------------------------------------------------- Gestational Age  LMP:           31w 6d        Date:  04/01/18                 EDD:   01/06/19  Best:          Stephanie Woods 6d     Det. By:  LMP  (04/01/18)          EDD:   01/06/19 ---------------------------------------------------------------------- Doppler - Fetal Vessels  Umbilical Artery   S/D     %tile     RI              PI                     ADFV    RDFV   3.6       90   0.73             1.24                        No      No ---------------------------------------------------------------------- Comments  This patient was seen for an ultrasound today due to possible  IUGR.  The patient denies any other problems in her current  pregnancy and reports that her blood pressures have been  within normal limits.  Doppler studies of the umbilical arteries performed today due  to possible IUGR, showed a slightly elevated S/D ratio of 3.6.  A biophysical profile performed today was 8 out of 8.  The patient was advised that due to IUGR, we will continue to  follow her with weekly fetal testing and umbilical artery  Doppler studies.  A  follow-up exam was scheduled in 1 week. ----------------------------------------------------------------------                   Ma Rings, MD Electronically Signed Final Report   11/10/2018 05:03 pm ----------------------------------------------------------------------  [place] Mfm Ob Follow Up  Result Date: 10/23/2018 ----------------------------------------------------------------------  OBSTETRICS REPORT                       (Signed Final 10/23/2018 11:57 am) ---------------------------------------------------------------------- Patient Info  ID #:       [id number]                          D.O.B.:  1990/10/25 (27 yrs)  Name:       Dewitt Hoes Terrero                  Visit Date: 10/23/2018 11:19 am ---------------------------------------------------------------------- Performed By  Performed By:     Percell Boston          Ref. Address:     Faculty                    RDMS  Attending:        Noralee Space MD        [facility]  Referred By:      Gigi Gin                    CONSTANT MD ---------------------------------------------------------------------- Orders   #  Description                          Code         Ordered By   1  [place] MFM OB FOLLOW UP                  16109.60     RAVI SHANKAR   2  [place] MFM UA CORD DOPPLER               76820.02     RAVI [facility]  ----------------------------------------------------------------------   #  Order #                    Accession #                 Episode #   1  [id number]                  [id number]                  [id number]   2  [id number]                  [id number]                  [id number]  ---------------------------------------------------------------------- Indications   [redacted] weeks gestation of pregnancy                Z3A.29   Cervical shortening complicating pregnancy     O26.879   Encounter for other antenatal screening        Z36.2   follow-up (low risk NIPS)   Obesity  complicating pregnancy, second         O99.212   trimester (pregravid BMI 34.03)   Maternal care for known or suspected poor      O36.5930   fetal growth, third trimester, not applicable or   unspecified  ---------------------------------------------------------------------- Vital Signs                                                 Height:        5'1" ---------------------------------------------------------------------- Fetal Evaluation  Num Of Fetuses:         1  Fetal Heart Rate(bpm):  146  Cardiac Activity:       Observed  Presentation:           Cephalic  Placenta:               Posterior  P. Cord Insertion:      Previously Visualized  Amniotic Fluid  AFI FV:      Within normal limits  AFI Sum(cm)     %Tile       Largest Pocket(cm)  12.98           37          4.13  RUQ(cm)       RLQ(cm)       LUQ(cm)        LLQ(cm)  2.88          3.45  2.52           4.13 ---------------------------------------------------------------------- Biometry  BPD:      69.7  mm     G. Age:  28w 0d          8  %    CI:        76.49   %    70 - 86                                                          FL/HC:      19.3   %    19.6 - 20.8  HC:      252.5  mm     G. Age:  27w 3d        < 1  %    HC/AC:      1.05        0.99 - 1.21  AC:      240.7  mm     G. Age:  28w 2d         18  %    FL/BPD:     70.0   %    71 - 87  FL:       48.8  mm     G. Age:  26w 3d        < 1  %    FL/AC:      20.3   %    20 - 24  HUM:      46.6  mm     G. Age:  27w 3d          9  %  Est. FW:    1096  gm      2 lb 7 oz      3  % ---------------------------------------------------------------------- OB History  Gravidity:    2  TOP:          1        Living:  0 ---------------------------------------------------------------------- Gestational Age  LMP:           29w 2d        Date:  04/01/18                 EDD:   01/06/19  U/S Today:     27w 4d                                        EDD:   01/18/19  Best:          29w 2d     Det. By:  LMP  (04/01/18)           EDD:   01/06/19 ---------------------------------------------------------------------- Anatomy  Cranium:               Appears normal         Aortic Arch:            Previously seen  Cavum:                 Previously seen        Ductal Arch:            Not well visualized  Ventricles:  Appears normal         Diaphragm:              Appears normal  Choroid Plexus:        Previously seen        Stomach:                Appears normal, left                                                                        sided  Cerebellum:            Previously seen        Abdomen:                Previously seen  Posterior Fossa:       Previously seen        Abdominal Wall:         Previously seen  Nuchal Fold:           Previously seen        Cord Vessels:           Previously seen  Face:                  Orbits and profile     Kidneys:                Appear normal                         previously seen  Lips:                  Previously seen        Bladder:                Appears normal  Thoracic:              Appears normal         Spine:                  Previously seen  Heart:                 Previously seen        Upper Extremities:      Previously seen  RVOT:                  Previously seen        Lower Extremities:      Previously seen  LVOT:                  Previously seen  Other:  Heels visualized previously. Nasal bone visualized previously.          Technically difficult due to fetal position. ---------------------------------------------------------------------- Doppler - Fetal Vessels  Umbilical Artery   S/D     %tile     RI              PI                     ADFV    RDFV  3.07       59   0.67  1.04                         N       N ---------------------------------------------------------------------- Cervix Uterus Adnexa  Cervix  Not visualized (advanced GA >24wks)  Uterus  No abnormality visualized.  Left Ovary  No adnexal mass visualized.  Right Ovary  No adnexal mass visualized.   Cul De Sac  No free fluid seen.  Adnexa  No abnormality visualized. ---------------------------------------------------------------------- Impression  Patient returned for fetal growth assessment. She takes  vaginal progesterone daily (cervical incompetence) and does  not have symptoms of preterm labor.  Amniotic fluid is normal and good fetal activity is seen. The  estimated fetal weight is at the 3rd percentile. HC measures  at between -2 to -1 SD. Femur length measures at less than  the 5th percentile. Umbilical artery Doppler showed normal  forward diastolic flow.  I explained the findings consistent with fetal growth  restriction. I also informed her that because it is difficult to  differentiate between growth restriction and a constitutionally  small fetus, we recommend close fetal surveillance till  delivery. ---------------------------------------------------------------------- Recommendations  -Weekly UA Doppler to continue.  -Fetal growth assessment at 32 weeks.  -If fetal growth restriction persists at 32 weeks, add weekly  BPP. ----------------------------------------------------------------------                  Noralee Space, MD Electronically Signed Final Report   10/23/2018 11:57 am ----------------------------------------------------------------------  [place] Mfm Ua Cord Doppler  Result Date: 11/10/2018 ----------------------------------------------------------------------  OBSTETRICS REPORT                       (Signed Final 11/10/2018 05:03 pm) ---------------------------------------------------------------------- Patient Info  ID #:       [id number]                          D.O.B.:  05-09-1990 (27 yrs)  Name:       Dewitt Hoes Mazzeo                  Visit Date: 11/10/2018 01:45 pm ---------------------------------------------------------------------- Performed By  Performed By:     Eden Lathe BS      Ref. Address:     Faculty                    RDMS RVT  Attending:        Ma Rings MD          [facility]  Referred By:      Gigi Gin                    CONSTANT MD ---------------------------------------------------------------------- Orders   #  Description                          Code         Ordered By   1  [place] MFM UA CORD DOPPLER  16109.60     RAVI SHANKAR   2  [place] MFM FETAL BPP WO NON              76819.01     RAVI [facility]      STRESS  ----------------------------------------------------------------------   #  Order #                    Accession #                 Episode #   1  [id number]                  [id number]   2  [id number]                  [id number]                  [id number]  ---------------------------------------------------------------------- Indications   Maternal care for known or suspected poor      O36.5930   fetal growth, third trimester, not applicable or   unspecified   Obesity complicating pregnancy, third          O99.213   trimester   Cervical shortening complicating pregnancy     O26.879   Encounter for other antenatal screening        Z36.2   follow-up (low risk NIPS)   [redacted] weeks gestation of pregnancy                Z3A.31  ---------------------------------------------------------------------- Vital Signs                                                 Height:        5'1" ---------------------------------------------------------------------- Fetal Evaluation  Num Of Fetuses:         1  Cardiac Activity:       Observed  Presentation:           Cephalic  Placenta:               Anterior  Amniotic Fluid  AFI FV:      Within normal limits  AFI Sum(cm)     %Tile       Largest Pocket(cm)  11.61           28          5.18  RUQ(cm)       RLQ(cm)       LUQ(cm)        LLQ(cm)  5.18          2.04          1.48           2.91 ---------------------------------------------------------------------- Biophysical Evaluation  Amniotic F.V:   Within normal limits        F. Tone:        Observed  F. Movement:    Observed                   Score:          8/8  F. Breathing:   Observed ---------------------------------------------------------------------- OB History  Gravidity:    2  TOP:          1  Living:  0 ---------------------------------------------------------------------- Gestational Age  LMP:           31w 6d        Date:  04/01/18                 EDD:   01/06/19  Best:          Stephanie Woods 6d     Det. By:  LMP  (04/01/18)          EDD:   01/06/19 ---------------------------------------------------------------------- Doppler - Fetal Vessels  Umbilical Artery   S/D     %tile     RI              PI                     ADFV    RDFV   3.6       90   0.73             1.24                        No      No ---------------------------------------------------------------------- Comments  This patient was seen for an ultrasound today due to possible  IUGR.  The patient denies any other problems in her current  pregnancy and reports that her blood pressures have been  within normal limits.  Doppler studies of the umbilical arteries performed today due  to possible IUGR, showed a slightly elevated S/D ratio of 3.6.  A biophysical profile performed today was 8 out of 8.  The patient was advised that due to IUGR, we will continue to  follow her with weekly fetal testing and umbilical artery  Doppler studies.  A follow-up exam was scheduled in 1 week. ----------------------------------------------------------------------                   Ma Rings, MD Electronically Signed Final Report   11/10/2018 05:03 pm ----------------------------------------------------------------------  [place] Mfm Ua Cord Doppler  Result Date: 11/05/2018 ----------------------------------------------------------------------  OBSTETRICS REPORT                       (Signed Final 11/05/2018 03:12 pm) ---------------------------------------------------------------------- Patient Info  ID #:       [id number]                           D.O.B.:  10/28/1990 (27 yrs)  Name:       Dewitt Hoes [place]                  Visit Date: 11/03/2018 10:07 am ---------------------------------------------------------------------- Performed By  Performed By:     Sandi Mealy        Ref. Address:      Faculty                    RDMS  Attending:        Lin Landsman      [facility]                    MD                                        [facility]  Referred By:  PEGGY                    CONSTANT MD ---------------------------------------------------------------------- Orders   #  Description                          Code         Ordered By   1  [place] MFM UA CORD DOPPLER               76820.02     RAVI [facility]  ----------------------------------------------------------------------   #  Order #                    Accession #                 Episode #   1  [id number]                  [id number]                  [id number]  ---------------------------------------------------------------------- Indications   Cervical shortening complicating pregnancy     O26.879   Encounter for other antenatal screening        Z36.2   follow-up (low risk NIPS)   Obesity complicating pregnancy, second         O99.212   trimester (pregravid BMI 34.03)   Maternal care for known or suspected poor      O36.5930   fetal growth, third trimester, not applicable or   unspecified   [redacted] weeks gestation of pregnancy                Z3A.30  ---------------------------------------------------------------------- Vital Signs  Weight (lb): 193                               Height:        5'1"  BMI:         36.46 ---------------------------------------------------------------------- Fetal Evaluation  Num Of Fetuses:          1  Fetal Heart Rate(bpm):   146  Cardiac Activity:        Observed  Presentation:            Cephalic  Placenta:                Posterior  P. Cord Insertion:       Visualized  Amniotic Fluid  AFI FV:      Within normal limits  AFI Sum(cm)      %Tile       Largest Pocket(cm)  8.37            4           5.12  RUQ(cm)       RLQ(cm)       LUQ(cm)        LLQ(cm)  3.25          0             5.12           0 ---------------------------------------------------------------------- OB History  Gravidity:    2  TOP:          1        Living:  0 ---------------------------------------------------------------------- Gestational Age  LMP:           30w 6d        Date:  04/01/18  EDD:   01/06/19  Best:          30w 6d     Det. By:  LMP  (04/01/18)          EDD:   01/06/19 ---------------------------------------------------------------------- Doppler - Fetal Vessels  Umbilical Artery   S/D     %tile                                            ADFV    RDFV  2.44       29                                                No      No ---------------------------------------------------------------------- Impression  UA Dopplers normal  Normal amniotic fluid  Prior EFW 3% ---------------------------------------------------------------------- Recommendations  Continue weekly testing with UA dopplers  Repeat growth in 2 weeks. ----------------------------------------------------------------------               Lin Landsman, MD Electronically Signed Final Report   11/05/2018 03:12 pm ----------------------------------------------------------------------  [place] Mfm Ua Cord Doppler  Result Date: 10/23/2018 ----------------------------------------------------------------------  OBSTETRICS REPORT                       (Signed Final 10/23/2018 11:57 am) ---------------------------------------------------------------------- Patient Info  ID #:       [id number]                          D.O.B.:  26-Nov-1990 (27 yrs)  Name:       Dewitt Hoes [place]                  Visit Date: 10/23/2018 11:19 am ---------------------------------------------------------------------- Performed By  Performed By:     Percell Boston          Ref. Address:     Faculty                    RDMS   Attending:        Noralee Space MD        [facility]  Referred By:      Catalina Antigua MD ---------------------------------------------------------------------- Orders   #  Description                          Code         Ordered By   1  [place] MFM OB FOLLOW UP                  40981.19     RAVI SHANKAR   2  [place] MFM UA CORD DOPPLER  16967.89     RAVI [facility]  ----------------------------------------------------------------------   #  Order #                    Accession #                 Episode #   1  [id number]                  [id number]                  [id number]   2  [id number]                  [id number]                  [id number]  ---------------------------------------------------------------------- Indications   [redacted] weeks gestation of pregnancy                Z3A.29   Cervical shortening complicating pregnancy     O26.879   Encounter for other antenatal screening        Z36.2   follow-up (low risk NIPS)   Obesity complicating pregnancy, second         O99.212   trimester (pregravid BMI 34.03)   Maternal care for known or suspected poor      O36.5930   fetal growth, third trimester, not applicable or   unspecified  ---------------------------------------------------------------------- Vital Signs                                                 Height:        5'1" ---------------------------------------------------------------------- Fetal Evaluation  Num Of Fetuses:         1  Fetal Heart Rate(bpm):  146  Cardiac Activity:       Observed  Presentation:           Cephalic  Placenta:               Posterior  P. Cord Insertion:      Previously Visualized  Amniotic Fluid  AFI FV:      Within normal limits  AFI Sum(cm)     %Tile       Largest Pocket(cm)  12.98           37          4.13  RUQ(cm)       RLQ(cm)       LUQ(cm)        LLQ(cm)  2.88          3.45          2.52           4.13  ---------------------------------------------------------------------- Biometry  BPD:      69.7  mm     G. Age:  28w 0d          8  %    CI:        76.49   %    70 - 86                                                          FL/HC:  19.3   %    19.6 - 20.8  HC:      252.5  mm     G. Age:  27w 3d        < 1  %    HC/AC:      1.05        0.99 - 1.21  AC:      240.7  mm     G. Age:  28w 2d         18  %    FL/BPD:     70.0   %    71 - 87  FL:       48.8  mm     G. Age:  26w 3d        < 1  %    FL/AC:      20.3   %    20 - 24  HUM:      46.6  mm     G. Age:  27w 3d          9  %  Est. FW:    1096  gm      2 lb 7 oz      3  % ---------------------------------------------------------------------- OB History  Gravidity:    2  TOP:          1        Living:  0 ---------------------------------------------------------------------- Gestational Age  LMP:           29w 2d        Date:  04/01/18                 EDD:   01/06/19  U/S Today:     27w 4d                                        EDD:   01/18/19  Best:          29w 2d     Det. By:  LMP  (04/01/18)          EDD:   01/06/19 ---------------------------------------------------------------------- Anatomy  Cranium:               Appears normal         Aortic Arch:            Previously seen  Cavum:                 Previously seen        Ductal Arch:            Not well visualized  Ventricles:            Appears normal         Diaphragm:              Appears normal  Choroid Plexus:        Previously seen        Stomach:                Appears normal, left  sided  Cerebellum:            Previously seen        Abdomen:                Previously seen  Posterior Fossa:       Previously seen        Abdominal Wall:         Previously seen  Nuchal Fold:           Previously seen        Cord Vessels:           Previously seen  Face:                  Orbits and profile     Kidneys:                Appear normal                          previously seen  Lips:                  Previously seen        Bladder:                Appears normal  Thoracic:              Appears normal         Spine:                  Previously seen  Heart:                 Previously seen        Upper Extremities:      Previously seen  RVOT:                  Previously seen        Lower Extremities:      Previously seen  LVOT:                  Previously seen  Other:  Heels visualized previously. Nasal bone visualized previously.          Technically difficult due to fetal position. ---------------------------------------------------------------------- Doppler - Fetal Vessels  Umbilical Artery   S/D     %tile     RI              PI                     ADFV    RDFV  3.07       59   0.67             1.04                         N       N ---------------------------------------------------------------------- Cervix Uterus Adnexa  Cervix  Not visualized (advanced GA >24wks)  Uterus  No abnormality visualized.  Left Ovary  No adnexal mass visualized.  Right Ovary  No adnexal mass visualized.  Cul De Sac  No free fluid seen.  Adnexa  No abnormality visualized. ---------------------------------------------------------------------- Impression  Patient returned for fetal growth assessment. She takes  vaginal progesterone daily (cervical incompetence) and does  not have symptoms of preterm labor.  Amniotic fluid is normal and good fetal activity is seen. The  estimated fetal weight is at the 3rd  percentile. HC measures  at between -2 to -1 SD. Femur length measures at less than  the 5th percentile. Umbilical artery Doppler showed normal  forward diastolic flow.  I explained the findings consistent with fetal growth  restriction. I also informed her that because it is difficult to  differentiate between growth restriction and a constitutionally  small fetus, we recommend close fetal surveillance till  delivery.  ---------------------------------------------------------------------- Recommendations  -Weekly UA Doppler to continue.  -Fetal growth assessment at 32 weeks.  -If fetal growth restriction persists at 32 weeks, add weekly  BPP. ----------------------------------------------------------------------                  Noralee Space, MD Electronically Signed Final Report   10/23/2018 11:57 am ----------------------------------------------------------------------   Assessment and Plan:  Pregnancy: [id number] at [redacted]w[redacted]d   Preterm labor symptoms and general obstetric precautions including but not limited to vaginal bleeding, contractions, leaking of fluid and fetal movement were reviewed in detail with the patient. I discussed the assessment and treatment plan with the patient. The patient was provided an opportunity to ask questions and all were answered. The patient agreed with the plan and demonstrated an understanding of the instructions. The patient was advised to call back or seek an in-person office evaluation/go to [facility] at [facility] for any urgent or concerning symptoms. Please refer to After Visit Summary for other counseling recommendations.   I provided 10 minutes of face-to-face time during this encounter.  Return in about 2 weeks (around 11/28/2018) for MyChart.  Future Appointments  Date Time Provider Department Center  11/17/2018 10:45 AM [facility] NURSE [facility]  11/17/2018 10:45 AM [facility] [place] 2 WH-MFC[facility] M[facility]-US  11/24/2018  8:00 AM [facility] NURSE [facility]  11/24/2018  8:00 AM WH-M[facility] [place] 3 WH-MFCUS M[facility]-US  12/01/2018  8:00 AM [facility]-M[facility] NURSE [facility]-US  12/01/2018  8:00 AM WH-MFC [place] 3 WH-MFCUS MFC-US    Coral Ceo, MD Center for [facility], [facility] 11/14/2018

## 2018-11-17 ENCOUNTER — Ambulatory Visit (HOSPITAL_COMMUNITY): Payer: BC Managed Care – PPO | Admitting: *Deleted

## 2018-11-17 ENCOUNTER — Other Ambulatory Visit: Payer: Self-pay

## 2018-11-17 ENCOUNTER — Ambulatory Visit (HOSPITAL_COMMUNITY)
Admission: RE | Admit: 2018-11-17 | Discharge: 2018-11-17 | Disposition: A | Discharge status: Home / Self Care | Payer: BC Managed Care – PPO | Source: Ambulatory Visit | Attending: Obstetrics and Gynecology | Admitting: Obstetrics and Gynecology

## 2018-11-17 ENCOUNTER — Other Ambulatory Visit (HOSPITAL_COMMUNITY): Payer: Self-pay | Admitting: Obstetrics and Gynecology

## 2018-11-17 ENCOUNTER — Encounter (HOSPITAL_COMMUNITY): Payer: Self-pay

## 2018-11-17 VITALS — BP 115/65 | HR 102 | Temp 98.2°F

## 2018-11-17 DIAGNOSIS — O365930 Maternal care for other known or suspected poor fetal growth, third trimester, not applicable or unspecified: Secondary | ICD-10-CM | POA: Insufficient documentation

## 2018-11-17 DIAGNOSIS — Z362 Encounter for other antenatal screening follow-up: Secondary | ICD-10-CM | POA: Diagnosis not present

## 2018-11-17 DIAGNOSIS — IMO0002 Reserved for concepts with insufficient information to code with codable children: Secondary | ICD-10-CM

## 2018-11-17 DIAGNOSIS — Z3A32 32 weeks gestation of pregnancy: Secondary | ICD-10-CM | POA: Diagnosis not present

## 2018-11-17 DIAGNOSIS — O99213 Obesity complicating pregnancy, third trimester: Secondary | ICD-10-CM

## 2018-11-24 ENCOUNTER — Encounter (HOSPITAL_COMMUNITY): Payer: Self-pay

## 2018-11-24 ENCOUNTER — Other Ambulatory Visit (HOSPITAL_COMMUNITY): Payer: Self-pay | Admitting: Obstetrics

## 2018-11-24 ENCOUNTER — Ambulatory Visit (HOSPITAL_COMMUNITY): Payer: BC Managed Care – PPO | Admitting: *Deleted

## 2018-11-24 ENCOUNTER — Other Ambulatory Visit: Payer: Self-pay

## 2018-11-24 ENCOUNTER — Ambulatory Visit (HOSPITAL_COMMUNITY)
Admission: RE | Admit: 2018-11-24 | Discharge: 2018-11-24 | Disposition: A | Discharge status: Home / Self Care | Payer: BC Managed Care – PPO | Source: Ambulatory Visit | Attending: Obstetrics and Gynecology | Admitting: Obstetrics and Gynecology

## 2018-11-24 DIAGNOSIS — O365930 Maternal care for other known or suspected poor fetal growth, third trimester, not applicable or unspecified: Secondary | ICD-10-CM

## 2018-11-24 DIAGNOSIS — Z3403 Encounter for supervision of normal first pregnancy, third trimester: Secondary | ICD-10-CM

## 2018-11-24 DIAGNOSIS — Z3A33 33 weeks gestation of pregnancy: Secondary | ICD-10-CM | POA: Diagnosis not present

## 2018-11-24 DIAGNOSIS — O99213 Obesity complicating pregnancy, third trimester: Secondary | ICD-10-CM | POA: Diagnosis not present

## 2018-11-24 NOTE — Procedures (Signed)
Stephanie Woods 02-08-1991 [redacted]w[redacted]d  Fetus A Non-Stress Test Interpretation for 11/24/18  Indication:   Fetal Heart Rate A Mode: External Baseline Rate (A): 135 bpm Variability: Moderate Accelerations: 10 x 10, 15 x 15 Decelerations: None Multiple birth?: No  Uterine Activity Mode: Palpation, Toco Contraction Frequency (min): U/I Contraction Duration (sec): 20-40 Contraction Quality: Mild(Pt denies feeling any) Resting Tone Palpated: Relaxed Resting Time: Adequate  Interpretation (Fetal Testing) Nonstress Test Interpretation: Reactive Overall Impression: Reassuring for gestational age Comments: Reviewed tracing with Dr. Donalee Citrin

## 2018-11-28 ENCOUNTER — Encounter: Payer: Self-pay | Admitting: Obstetrics

## 2018-11-28 ENCOUNTER — Telehealth (INDEPENDENT_AMBULATORY_CARE_PROVIDER_SITE_OTHER): Payer: BC Managed Care – PPO | Admitting: Obstetrics

## 2018-11-28 DIAGNOSIS — Z3A34 34 weeks gestation of pregnancy: Secondary | ICD-10-CM

## 2018-11-28 DIAGNOSIS — Z131 Encounter for screening for diabetes mellitus: Secondary | ICD-10-CM

## 2018-11-28 DIAGNOSIS — O0993 Supervision of high risk pregnancy, unspecified, third trimester: Secondary | ICD-10-CM

## 2018-11-28 DIAGNOSIS — IMO0002 Reserved for concepts with insufficient information to code with codable children: Secondary | ICD-10-CM

## 2018-11-28 DIAGNOSIS — O0990 Supervision of high risk pregnancy, unspecified, unspecified trimester: Secondary | ICD-10-CM

## 2018-11-28 NOTE — Progress Notes (Signed)
ROB   CC: Pt notices she is thirsty all the time.  Pt noted visual changes no headaches, swelling in hands and feet.    Batteries died while trying to check B/P over the phone. Pt was able to get new batteries and check B/p while on phone.   B/P:126/78 P: 88

## 2018-11-28 NOTE — Progress Notes (Signed)
TELEHEALTH OBSTETRICS PRENATAL VIRTUAL VIDEO VISIT ENCOUNTER NOTE  Provider location: [facility] [place]   I connected with Corvette Lietzke on 11/28/18 at 10:45 AM EDT by OB MyChart Video Encounter at home and verified that I am speaking with the correct person using two identifiers.   I discussed the limitations, risks, security and privacy concerns of performing an evaluation and management service virtually and the availability of in person appointments. I also discussed with the patient that there may be a patient responsible charge related to this service. The patient expressed understanding and agreed to proceed. Subjective:  Stephanie Woods is a 28 y.o. G2P0010 at [redacted]w[redacted]d being seen today for ongoing prenatal care.  She is currently monitored for the following issues for this high-risk pregnancy and has Other insomnia; Cigarette nicotine dependence without complication; Supervision of normal first pregnancy; and Short cervix affecting pregnancy on their problem list.  Patient reports significantly increased thirst and drinking lots of water, with significant urinary frequency at night.  Contractions: Irritability. Vag. Bleeding: None.  Movement: Present. Denies any leaking of fluid.   The following portions of the patient's history were reviewed and updated as appropriate: allergies, current medications, past family history, past medical history, past social history, past surgical history and problem list.   Objective:  There were no vitals filed for this visit.  Fetal Status:     Movement: Present     General:  Alert, oriented and cooperative. Patient is in no acute distress.  Respiratory: Normal respiratory effort, no problems with respiration noted  Mental Status: Normal mood and affect. Normal behavior. Normal judgment and thought content.  Rest of physical exam deferred due to type of encounter  Imaging: [place] Mfm Fetal Bpp W/nonstress  Result Date:  11/24/2018 ----------------------------------------------------------------------  OBSTETRICS REPORT                       (Signed Final 11/24/2018 09:50 am) ---------------------------------------------------------------------- Patient Info  ID #:       [id number]                          D.O.B.:  1990-10-07 (27 yrs)  Name:       [name]                  Visit Date: 11/24/2018 08:23 am ---------------------------------------------------------------------- Performed By  Performed By:     Lenise Arena        Ref. Address:     Faculty                    RDMS  Attending:        Noralee Space MD        [facility]  Referred By:      Catalina Antigua MD ---------------------------------------------------------------------- Orders   #  Description  Code         Ordered By   1  [place] MFM UA CORD DOPPLER               [id number]     Rosana Hoes   2  [place] MFM FETAL BPP                     16109.6      Rosana Hoes      W/NONSTRESS  ----------------------------------------------------------------------   #  Order #                    Accession #                 Episode #   1  [id number]                  [id number]                  [id number]   2  [id number]                  [id number]                  [id number]  ---------------------------------------------------------------------- Indications   Maternal care for known or suspected poor      O36.5930   fetal growth, third trimester, not applicable or   unspecified   Obesity complicating pregnancy, third          O99.213   trimester   [redacted] weeks gestation of pregnancy                Z3A.33  ---------------------------------------------------------------------- Vital Signs  Weight (lb): 201                               Height:        5'1"  BMI:         37.97 ---------------------------------------------------------------------- Fetal Evaluation  Num Of  Fetuses:         1  Fetal Heart Rate(bpm):  134  Cardiac Activity:       Observed  Presentation:           Cephalic  Amniotic Fluid  AFI FV:      Within normal limits  AFI Sum(cm)     %Tile       Largest Pocket(cm)  10.73           24          5.67  RUQ(cm)       RLQ(cm)       LUQ(cm)        LLQ(cm)  5.67          0             3.5            1.56 ---------------------------------------------------------------------- Biophysical Evaluation  Amniotic F.V:   Within normal limits       F. Tone:        Observed  F. Movement:    Observed                   N.S.T:          Reactive  F. Breathing:   Not Observed               Score:          8/10 ---------------------------------------------------------------------- OB History  Gravidity:    2  TOP:          1        Living:  0 ---------------------------------------------------------------------- Gestational Age  LMP:           33w 6d        Date:  04/01/18                 EDD:   01/06/19  Best:          33w 6d     Det. By:  LMP  (04/01/18)          EDD:   01/06/19 ---------------------------------------------------------------------- Anatomy  Thoracic:              Appears normal         Abdomen:                Appears normal  Diaphragm:             Appears normal         Bladder:                Appears normal  Stomach:               Appears normal, left                         sided ---------------------------------------------------------------------- Doppler - Fetal Vessels  Umbilical Artery   S/D     %tile  3.07       79 ---------------------------------------------------------------------- Cervix Uterus Adnexa  Cervix  Not visualized (advanced GA >24wks) ---------------------------------------------------------------------- Impression  Severe fetal growth restriction. Patient returned for antenatal  testing.  Amniotic fluid is normal and good fetal activity is seen. Fetal  breathing movements did not meet the criteria (BPP).  Umbilical artery Doppler showed normal  forward diastolic  flow. NST is reactive. BPP 8/10.  We reassured the patient of the findings. ---------------------------------------------------------------------- Recommendations  -Continue weekly BPP and Doppler till delivery. ----------------------------------------------------------------------                  Noralee Space, MD Electronically Signed Final Report   11/24/2018 09:50 am ----------------------------------------------------------------------  [place] Mfm Fetal Bpp Wo Non Stress  Result Date: 11/17/2018 ----------------------------------------------------------------------  OBSTETRICS REPORT                       (Signed Final 11/17/2018 04:57 pm) ---------------------------------------------------------------------- Patient Info  ID #:       [id number]                          D.O.B.:  07/19/1990 (27 yrs)  Name:       [name]                  Visit Date: 11/17/2018 10:56 am ---------------------------------------------------------------------- Performed By  Performed By:     Percell Boston          Ref. Address:     Faculty                    RDMS  Attending:        Lin Landsman      [facility]                    MD  Fetal Care  Referred By:      Gigi Gin                    CONSTANT MD ---------------------------------------------------------------------- Orders   #  Description                          Code         Ordered By   1  [place] MFM OB FOLLOW UP                  16109.60     RAVI SHANKAR   2  [place] MFM UA CORD DOPPLER               76820.02     RAVI SHANKAR   3  [place] MFM FETAL BPP WO NON              76819.01     RAVI [facility]      STRESS  ----------------------------------------------------------------------   #  Order #                    Accession #                 Episode #   1  [id number]                  [id number]                  [id number]   2  [id number]                  [id number]                  [id number]   3  [id number]                   [id number]                  [id number]  ---------------------------------------------------------------------- Indications   [redacted] weeks gestation of pregnancy                Z3A.32   Maternal care for known or suspected poor      O36.5930   fetal growth, third trimester, not applicable or   unspecified   Obesity complicating pregnancy, third          O99.213   trimester   Encounter for other antenatal screening        Z36.2   follow-up (low risk NIPS)  ---------------------------------------------------------------------- Vital Signs                                                 Height:        5'1" ---------------------------------------------------------------------- Fetal Evaluation  Num Of Fetuses:         1  Fetal Heart Rate(bpm):  135  Cardiac Activity:       Observed  Presentation:           Cephalic  Placenta:               Fundal  P. Cord Insertion:      Previously Visualized  Amniotic Fluid  AFI FV:      Within normal limits  AFI Sum(cm)     %Tile  Largest Pocket(cm)  13.76           46          5.04  RUQ(cm)       RLQ(cm)       LUQ(cm)        LLQ(cm)  5.04          2.56          2.05           4.11 ---------------------------------------------------------------------- Biophysical Evaluation  Amniotic F.V:   Within normal limits       F. Tone:        Observed  F. Movement:    Observed                   Score:          8/8  F. Breathing:   Observed ---------------------------------------------------------------------- Biometry  BPD:      71.4  mm     G. Age:  28w 5d        < 1  %    CI:        67.65   %    70 - 86                                                          FL/HC:      19.4   %    19.9 - 21.5  HC:      277.8  mm     G. Age:  30w 3d        < 1  %    HC/AC:      1.00        0.96 - 1.11  AC:      278.3  mm     G. Age:  31w 6d         23  %    FL/BPD:     75.6   %    71 - 87  FL:         54  mm     G. Age:  28w 4d        < 1  %    FL/AC:      19.4   %    20 - 24  HUM:      50.8   mm     G. Age:  29w 5d        < 5  %  Est. FW:    1578  gm      3 lb 8 oz      2  % ---------------------------------------------------------------------- OB History  Gravidity:    2  TOP:          1        Living:  0 ---------------------------------------------------------------------- Gestational Age  LMP:           32w 6d        Date:  04/01/18                 EDD:   01/06/19  U/S Today:     29w 6d  EDD:   01/27/19  Best:          Armida Sans 6d     Det. By:  LMP  (04/01/18)          EDD:   01/06/19 ---------------------------------------------------------------------- Anatomy  Cranium:               Appears normal         Aortic Arch:            Previously seen  Cavum:                 Previously seen        Ductal Arch:            Not well visualized  Ventricles:            Appears normal         Diaphragm:              Appears normal  Choroid Plexus:        Previously seen        Stomach:                Appears normal, left                                                                        sided  Cerebellum:            Previously seen        Abdomen:                Previously seen  Posterior Fossa:       Previously seen        Abdominal Wall:         Previously seen  Nuchal Fold:           Previously seen        Cord Vessels:           Previously seen  Face:                  Orbits and profile     Kidneys:                Appear normal                         previously seen  Lips:                  Previously seen        Bladder:                Appears normal  Thoracic:              Appears normal         Spine:                  Previously seen  Heart:                 Previously seen        Upper Extremities:      Previously seen  RVOT:                  Previously seen  Lower Extremities:      Previously seen  LVOT:                  Previously seen  Other:  Heels visualized previously. Nasal bone visualized previously.          Technically difficult due to fetal position.  ---------------------------------------------------------------------- Doppler - Fetal Vessels  Umbilical Artery   S/D     %tile     RI              PI                     ADFV    RDFV  2.52       44     0.6            0.92                         N       N ---------------------------------------------------------------------- Cervix Uterus Adnexa  Cervix  Not visualized (advanced GA >24wks)  Uterus  No abnormality visualized.  Left Ovary  No adnexal mass visualized.  Right Ovary  No adnexal mass visualized.  Cul De Sac  No free fluid seen.  Adnexa  No abnormality visualized. ---------------------------------------------------------------------- Impression  Known IUGR, AC normal.  UA Dopplers are normal  Biophysical profile 8/8 ---------------------------------------------------------------------- Recommendations  Continue weekly testing with UA Dopplers  Consider delivery by 37 weeks given EFW 3% ----------------------------------------------------------------------               Lin Landsman, MD Electronically Signed Final Report   11/17/2018 04:57 pm ----------------------------------------------------------------------  [place] Mfm Fetal Bpp Wo Non Stress  Result Date: 11/10/2018 ----------------------------------------------------------------------  OBSTETRICS REPORT                       (Signed Final 11/10/2018 05:03 pm) ---------------------------------------------------------------------- Patient Info  ID #:       [id number]                          D.O.B.:  04-Nov-1990 (27 yrs)  Name:       [name]                  Visit Date: 11/10/2018 01:45 pm ---------------------------------------------------------------------- Performed By  Performed By:     Eden Lathe BS      Ref. Address:     Faculty                    RDMS RVT  Attending:        Ma Rings MD         [facility]  Referred By:      Catalina Antigua MD ---------------------------------------------------------------------- Orders   #  Description  Code         Ordered By   1  [place] MFM UA CORD DOPPLER               [id number]     RAVI SHANKAR   2  [place] MFM FETAL BPP WO NON              76819.01     RAVI [facility]      STRESS  ----------------------------------------------------------------------   #  Order #                    Accession #                 Episode #   1  [id number]                  [id number]                  [id number]   2  [id number]                  [id number]                  [id number]  ---------------------------------------------------------------------- Indications   Maternal care for known or suspected poor      O36.5930   fetal growth, third trimester, not applicable or   unspecified   Obesity complicating pregnancy, third          O99.213   trimester   Cervical shortening complicating pregnancy     O26.879   Encounter for other antenatal screening        Z36.2   follow-up (low risk NIPS)   [redacted] weeks gestation of pregnancy                Z3A.31  ---------------------------------------------------------------------- Vital Signs                                                 Height:        5'1" ---------------------------------------------------------------------- Fetal Evaluation  Num Of Fetuses:         1  Cardiac Activity:       Observed  Presentation:           Cephalic  Placenta:               Anterior  Amniotic Fluid  AFI FV:      Within normal limits  AFI Sum(cm)     %Tile       Largest Pocket(cm)  11.61           28          5.18  RUQ(cm)       RLQ(cm)       LUQ(cm)        LLQ(cm)  5.18          2.04          1.48           2.91 ---------------------------------------------------------------------- Biophysical Evaluation  Amniotic F.V:   Within normal limits       F. Tone:        Observed  F. Movement:    Observed                   Score:          8/8  F. Breathing:   Observed  ---------------------------------------------------------------------- OB History  Gravidity:    2  TOP:          1        Living:  0 ---------------------------------------------------------------------- Gestational Age  LMP:           31w 6d        Date:  04/01/18                 EDD:   01/06/19  Best:          31w 6d     Det. By:  LMP  (04/01/18)          EDD:   01/06/19 ---------------------------------------------------------------------- Doppler - Fetal Vessels  Umbilical Artery   S/D     %tile     RI              PI                     ADFV    RDFV   3.6       90   0.73             1.24                        No      No ---------------------------------------------------------------------- Comments  This patient was seen for an ultrasound today due to possible  IUGR.  The patient denies any other problems in her current  pregnancy and reports that her blood pressures have been  within normal limits.  Doppler studies of the umbilical arteries performed today due  to possible IUGR, showed a slightly elevated S/D ratio of 3.6.  A biophysical profile performed today was 8 out of 8.  The patient was advised that due to IUGR, we will continue to  follow her with weekly fetal testing and umbilical artery  Doppler studies.  A follow-up exam was scheduled in 1 week. ----------------------------------------------------------------------                   Ma Rings, MD Electronically Signed Final Report   11/10/2018 05:03 pm ----------------------------------------------------------------------  [place] Mfm Ob Follow Up  Result Date: 11/17/2018 ----------------------------------------------------------------------  OBSTETRICS REPORT                       (Signed Final 11/17/2018 04:57 pm) ---------------------------------------------------------------------- Patient Info  ID #:       [id number]                          D.O.B.:  1990/09/04 (27 yrs)  Name:       [name]                  Visit Date: 11/17/2018 10:56  am ---------------------------------------------------------------------- Performed By  Performed By:     Percell Boston          Ref. Address:     Faculty                    RDMS  Attending:        Lin Landsman      [facility]                    MD  Fetal Care  Referred By:      Gigi Gin                    CONSTANT MD ---------------------------------------------------------------------- Orders   #  Description                          Code         Ordered By   1  [place] MFM OB FOLLOW UP                  40981.19     RAVI SHANKAR   2  [place] MFM UA CORD DOPPLER               76820.02     RAVI SHANKAR   3  [place] MFM FETAL BPP WO NON              76819.01     RAVI [facility]      STRESS  ----------------------------------------------------------------------   #  Order #                    Accession #                 Episode #   1  [id number]                  [id number]                  [id number]   2  [id number]                  [id number]                  [id number]   3  [id number]                  [id number]                  [id number]  ---------------------------------------------------------------------- Indications   [redacted] weeks gestation of pregnancy                Z3A.32   Maternal care for known or suspected poor      O36.5930   fetal growth, third trimester, not applicable or   unspecified   Obesity complicating pregnancy, third          O99.213   trimester   Encounter for other antenatal screening        Z36.2   follow-up (low risk NIPS)  ---------------------------------------------------------------------- Vital Signs                                                 Height:        5'1" ---------------------------------------------------------------------- Fetal Evaluation  Num Of Fetuses:         1  Fetal Heart Rate(bpm):  135  Cardiac Activity:       Observed  Presentation:           Cephalic  Placenta:               Fundal  P. Cord Insertion:      Previously  Visualized  Amniotic Fluid  AFI FV:      Within normal limits  AFI Sum(cm)     %Tile  Largest Pocket(cm)  13.76           46          5.04  RUQ(cm)       RLQ(cm)       LUQ(cm)        LLQ(cm)  5.04          2.56          2.05           4.11 ---------------------------------------------------------------------- Biophysical Evaluation  Amniotic F.V:   Within normal limits       F. Tone:        Observed  F. Movement:    Observed                   Score:          8/8  F. Breathing:   Observed ---------------------------------------------------------------------- Biometry  BPD:      71.4  mm     G. Age:  28w 5d        < 1  %    CI:        67.65   %    70 - 86                                                          FL/HC:      19.4   %    19.9 - 21.5  HC:      277.8  mm     G. Age:  30w 3d        < 1  %    HC/AC:      1.00        0.96 - 1.11  AC:      278.3  mm     G. Age:  31w 6d         23  %    FL/BPD:     75.6   %    71 - 87  FL:         54  mm     G. Age:  28w 4d        < 1  %    FL/AC:      19.4   %    20 - 24  HUM:      50.8  mm     G. Age:  29w 5d        < 5  %  Est. FW:    1578  gm      3 lb 8 oz      2  % ---------------------------------------------------------------------- OB History  Gravidity:    2  TOP:          1        Living:  0 ---------------------------------------------------------------------- Gestational Age  LMP:           32w 6d        Date:  04/01/18                 EDD:   01/06/19  U/S Today:     29w 6d  EDD:   01/27/19  Best:          Armida Sans 6d     Det. By:  LMP  (04/01/18)          EDD:   01/06/19 ---------------------------------------------------------------------- Anatomy  Cranium:               Appears normal         Aortic Arch:            Previously seen  Cavum:                 Previously seen        Ductal Arch:            Not well visualized  Ventricles:            Appears normal         Diaphragm:              Appears normal  Choroid Plexus:         Previously seen        Stomach:                Appears normal, left                                                                        sided  Cerebellum:            Previously seen        Abdomen:                Previously seen  Posterior Fossa:       Previously seen        Abdominal Wall:         Previously seen  Nuchal Fold:           Previously seen        Cord Vessels:           Previously seen  Face:                  Orbits and profile     Kidneys:                Appear normal                         previously seen  Lips:                  Previously seen        Bladder:                Appears normal  Thoracic:              Appears normal         Spine:                  Previously seen  Heart:                 Previously seen        Upper Extremities:      Previously seen  RVOT:                  Previously seen  Lower Extremities:      Previously seen  LVOT:                  Previously seen  Other:  Heels visualized previously. Nasal bone visualized previously.          Technically difficult due to fetal position. ---------------------------------------------------------------------- Doppler - Fetal Vessels  Umbilical Artery   S/D     %tile     RI              PI                     ADFV    RDFV  2.52       44     0.6            0.92                         N       N ---------------------------------------------------------------------- Cervix Uterus Adnexa  Cervix  Not visualized (advanced GA >24wks)  Uterus  No abnormality visualized.  Left Ovary  No adnexal mass visualized.  Right Ovary  No adnexal mass visualized.  Cul De Sac  No free fluid seen.  Adnexa  No abnormality visualized. ---------------------------------------------------------------------- Impression  Known IUGR, AC normal.  UA Dopplers are normal  Biophysical profile 8/8 ---------------------------------------------------------------------- Recommendations  Continue weekly testing with UA Dopplers  Consider delivery by 37 weeks  given EFW 3% ----------------------------------------------------------------------               Lin Landsmanorenthian Booker, MD Electronically Signed Final Report   11/17/2018 04:57 pm ----------------------------------------------------------------------  [place] Mfm Ua Cord Doppler  Result Date: 11/24/2018 ----------------------------------------------------------------------  OBSTETRICS REPORT                       (Signed Final 11/24/2018 09:50 am) ---------------------------------------------------------------------- Patient Info  ID #:       [id number]                          D.O.B.:  [birth year] (27 yrs)  Name:       Molly MaduroORTNEY Coats                  Visit Date: 11/24/2018 08:23 am ---------------------------------------------------------------------- Performed By  Performed By:     Lenise ArenaHannah Bazemore        Ref. Address:     Faculty                    RDMS  Attending:        Noralee Spaceavi Shankar MD        [facility]  Referred By:      Catalina AntiguaPEGGY                    CONSTANT MD ---------------------------------------------------------------------- Orders   #  Description  Code         Ordered By   1  [place] MFM UA CORD DOPPLER               [id number]     Peterson Ao   2  [place] MFM FETAL BPP                     93267.1      Peterson Ao      W/NONSTRESS  ----------------------------------------------------------------------   #  Order #                    Accession #                 Episode #   1  [id number]                  [id number]                  [id number]   2  [id number]                  [id number]  ---------------------------------------------------------------------- Indications   Maternal care for known or suspected poor      O36.5930   fetal growth, third trimester, not applicable or   unspecified   Obesity complicating pregnancy, third          O99.213   trimester   [redacted] weeks gestation of pregnancy                 Z3A.33  ---------------------------------------------------------------------- Vital Signs  Weight (lb): 201                               Height:        5'1"  BMI:         37.97 ---------------------------------------------------------------------- Fetal Evaluation  Num Of Fetuses:         1  Fetal Heart Rate(bpm):  134  Cardiac Activity:       Observed  Presentation:           Cephalic  Amniotic Fluid  AFI FV:      Within normal limits  AFI Sum(cm)     %Tile       Largest Pocket(cm)  10.73           24          5.67  RUQ(cm)       RLQ(cm)       LUQ(cm)        LLQ(cm)  5.67          0             3.5            1.56 ---------------------------------------------------------------------- Biophysical Evaluation  Amniotic F.V:   Within normal limits       F. Tone:        Observed  F. Movement:    Observed                   N.S.T:          Reactive  F. Breathing:   Not Observed               Score:          8/10 ---------------------------------------------------------------------- OB History  Gravidity:    2  TOP:          1        Living:  0 ---------------------------------------------------------------------- Gestational Age  LMP:           33w 6d        Date:  04/01/18                 EDD:   01/06/19  Best:          33w 6d     Det. By:  LMP  (04/01/18)          EDD:   01/06/19 ---------------------------------------------------------------------- Anatomy  Thoracic:              Appears normal         Abdomen:                Appears normal  Diaphragm:             Appears normal         Bladder:                Appears normal  Stomach:               Appears normal, left                         sided ---------------------------------------------------------------------- Doppler - Fetal Vessels  Umbilical Artery   S/D     %tile  3.07       79 ---------------------------------------------------------------------- Cervix Uterus Adnexa  Cervix  Not visualized (advanced GA >24wks)  ---------------------------------------------------------------------- Impression  Severe fetal growth restriction. Patient returned for antenatal  testing.  Amniotic fluid is normal and good fetal activity is seen. Fetal  breathing movements did not meet the criteria (BPP).  Umbilical artery Doppler showed normal forward diastolic  flow. NST is reactive. BPP 8/10.  We reassured the patient of the findings. ---------------------------------------------------------------------- Recommendations  -Continue weekly BPP and Doppler till delivery. ----------------------------------------------------------------------                  Noralee Space, MD Electronically Signed Final Report   11/24/2018 09:50 am ----------------------------------------------------------------------  [place] Mfm Ua Cord Doppler  Result Date: 11/17/2018 ----------------------------------------------------------------------  OBSTETRICS REPORT                       (Signed Final 11/17/2018 04:57 pm) ---------------------------------------------------------------------- Patient Info  ID #:       [id number]                          D.O.B.:  November 10, 1990 (27 yrs)  Name:       [name]                  Visit Date: 11/17/2018 10:56 am ---------------------------------------------------------------------- Performed By  Performed By:     Percell Boston          Ref. Address:     Faculty                    RDMS  Attending:        Lin Landsman      [facility]                    MD  Fetal Care  Referred By:      Gigi Gin                    CONSTANT MD ---------------------------------------------------------------------- Orders   #  Description                          Code         Ordered By   1  [place] MFM OB FOLLOW UP                  96045.40     RAVI SHANKAR   2  [place] MFM UA CORD DOPPLER               76820.02     RAVI SHANKAR   3  [place] MFM FETAL BPP WO NON              76819.01     RAVI [facility]       STRESS  ----------------------------------------------------------------------   #  Order #                    Accession #                 Episode #   1  [id number]                  [id number]                  [id number]   2  [id number]                  [id number]   3  [id number]                  [id number]                  [id number]  ---------------------------------------------------------------------- Indications   [redacted] weeks gestation of pregnancy                Z3A.32   Maternal care for known or suspected poor      O36.5930   fetal growth, third trimester, not applicable or   unspecified   Obesity complicating pregnancy, third          O99.213   trimester   Encounter for other antenatal screening        Z36.2   follow-up (low risk NIPS)  ---------------------------------------------------------------------- Vital Signs                                                 Height:        5'1" ---------------------------------------------------------------------- Fetal Evaluation  Num Of Fetuses:         1  Fetal Heart Rate(bpm):  135  Cardiac Activity:       Observed  Presentation:           Cephalic  Placenta:               Fundal  P. Cord Insertion:      Previously Visualized  Amniotic Fluid  AFI FV:      Within normal limits  AFI Sum(cm)     %Tile  Largest Pocket(cm)  13.76           46          5.04  RUQ(cm)       RLQ(cm)       LUQ(cm)        LLQ(cm)  5.04          2.56          2.05           4.11 ---------------------------------------------------------------------- Biophysical Evaluation  Amniotic F.V:   Within normal limits       F. Tone:        Observed  F. Movement:    Observed                   Score:          8/8  F. Breathing:   Observed ---------------------------------------------------------------------- Biometry  BPD:      71.4  mm     G. Age:  28w 5d        < 1  %    CI:        67.65   %    70 - 86                                                          FL/HC:      19.4    %    19.9 - 21.5  HC:      277.8  mm     G. Age:  30w 3d        < 1  %    HC/AC:      1.00        0.96 - 1.11  AC:      278.3  mm     G. Age:  31w 6d         23  %    FL/BPD:     75.6   %    71 - 87  FL:         54  mm     G. Age:  28w 4d        < 1  %    FL/AC:      19.4   %    20 - 24  HUM:      50.8  mm     G. Age:  29w 5d        < 5  %  Est. FW:    1578  gm      3 lb 8 oz      2  % ---------------------------------------------------------------------- OB History  Gravidity:    2  TOP:          1        Living:  0 ---------------------------------------------------------------------- Gestational Age  LMP:           32w 6d        Date:  04/01/18                 EDD:   01/06/19  U/S Today:     29w 6d  EDD:   01/27/19  Best:          Armida Sans 6d     Det. By:  LMP  (04/01/18)          EDD:   01/06/19 ---------------------------------------------------------------------- Anatomy  Cranium:               Appears normal         Aortic Arch:            Previously seen  Cavum:                 Previously seen        Ductal Arch:            Not well visualized  Ventricles:            Appears normal         Diaphragm:              Appears normal  Choroid Plexus:        Previously seen        Stomach:                Appears normal, left                                                                        sided  Cerebellum:            Previously seen        Abdomen:                Previously seen  Posterior Fossa:       Previously seen        Abdominal Wall:         Previously seen  Nuchal Fold:           Previously seen        Cord Vessels:           Previously seen  Face:                  Orbits and profile     Kidneys:                Appear normal                         previously seen  Lips:                  Previously seen        Bladder:                Appears normal  Thoracic:              Appears normal         Spine:                  Previously seen  Heart:                  Previously seen        Upper Extremities:      Previously seen  RVOT:                  Previously seen  Lower Extremities:      Previously seen  LVOT:                  Previously seen  Other:  Heels visualized previously. Nasal bone visualized previously.          Technically difficult due to fetal position. ---------------------------------------------------------------------- Doppler - Fetal Vessels  Umbilical Artery   S/D     %tile     RI              PI                     ADFV    RDFV  2.52       44     0.6            0.92                         N       N ---------------------------------------------------------------------- Cervix Uterus Adnexa  Cervix  Not visualized (advanced GA >24wks)  Uterus  No abnormality visualized.  Left Ovary  No adnexal mass visualized.  Right Ovary  No adnexal mass visualized.  Cul De Sac  No free fluid seen.  Adnexa  No abnormality visualized. ---------------------------------------------------------------------- Impression  Known IUGR, AC normal.  UA Dopplers are normal  Biophysical profile 8/8 ---------------------------------------------------------------------- Recommendations  Continue weekly testing with UA Dopplers  Consider delivery by 37 weeks given EFW 3% ----------------------------------------------------------------------               Lin Landsman, MD Electronically Signed Final Report   11/17/2018 04:57 pm ----------------------------------------------------------------------  [place] Mfm Ua Cord Doppler  Result Date: 11/10/2018 ----------------------------------------------------------------------  OBSTETRICS REPORT                       (Signed Final 11/10/2018 05:03 pm) ---------------------------------------------------------------------- Patient Info  ID #:       [id number]                          D.O.B.:  [birth year] (27 yrs)  Name:       [name]                  Visit Date: 11/10/2018 01:45 pm  ---------------------------------------------------------------------- Performed By  Performed By:     Eden Lathe BS      Ref. Address:     Faculty                    RDMS RVT  Attending:        Ma Rings MD         [facility]  Referred By:      Catalina Antigua MD ---------------------------------------------------------------------- Orders   #  Description  Code         Ordered By   1  [place] MFM UA CORD DOPPLER               [id number]     RAVI SHANKAR   2  [place] MFM FETAL BPP WO NON              76819.01     RAVI [facility]      STRESS  ----------------------------------------------------------------------   #  Order #                    Accession #                 Episode #   1  [id number]                  [id number]                  [id number]   2  [id number]                  [id number]                  [id number]  ---------------------------------------------------------------------- Indications   Maternal care for known or suspected poor      O36.5930   fetal growth, third trimester, not applicable or   unspecified   Obesity complicating pregnancy, third          O99.213   trimester   Cervical shortening complicating pregnancy     O26.879   Encounter for other antenatal screening        Z36.2   follow-up (low risk NIPS)   [redacted] weeks gestation of pregnancy                Z3A.31  ---------------------------------------------------------------------- Vital Signs                                                 Height:        5'1" ---------------------------------------------------------------------- Fetal Evaluation  Num Of Fetuses:         1  Cardiac Activity:       Observed  Presentation:           Cephalic  Placenta:               Anterior  Amniotic Fluid  AFI FV:      Within normal limits  AFI Sum(cm)     %Tile       Largest Pocket(cm)  11.61           28          5.18  RUQ(cm)        RLQ(cm)       LUQ(cm)        LLQ(cm)  5.18          2.04          1.48           2.91 ---------------------------------------------------------------------- Biophysical Evaluation  Amniotic F.V:   Within normal limits       F. Tone:        Observed  F. Movement:    Observed                   Score:  8/8  F. Breathing:   Observed ---------------------------------------------------------------------- OB History  Gravidity:    2  TOP:          1        Living:  0 ---------------------------------------------------------------------- Gestational Age  LMP:           31w 6d        Date:  04/01/18                 EDD:   01/06/19  Best:          [name] 6d     Det. By:  LMP  (04/01/18)          EDD:   01/06/19 ---------------------------------------------------------------------- Doppler - Fetal Vessels  Umbilical Artery   S/D     %tile     RI              PI                     ADFV    RDFV   3.6       90   0.73             1.24                        No      No ---------------------------------------------------------------------- Comments  This patient was seen for an ultrasound today due to possible  IUGR.  The patient denies any other problems in her current  pregnancy and reports that her blood pressures have been  within normal limits.  Doppler studies of the umbilical arteries performed today due  to possible IUGR, showed a slightly elevated S/D ratio of 3.6.  A biophysical profile performed today was 8 out of 8.  The patient was advised that due to IUGR, we will continue to  follow her with weekly fetal testing and umbilical artery  Doppler studies.  A follow-up exam was scheduled in 1 week. ----------------------------------------------------------------------                   Ma Rings, MD Electronically Signed Final Report   11/10/2018 05:03 pm ----------------------------------------------------------------------  [place] Mfm Ua Cord Doppler  Result Date:  11/05/2018 ----------------------------------------------------------------------  OBSTETRICS REPORT                       (Signed Final 11/05/2018 03:12 pm) ---------------------------------------------------------------------- Patient Info  ID #:       [id number]                          D.O.B.:  [birth year] (27 yrs)  Name:       Dewitt Hoes Dearman                  Visit Date: 11/03/2018 10:07 am ---------------------------------------------------------------------- Performed By  Performed By:     Sandi Mealy        Ref. Address:      Faculty                    RDMS  Attending:        Lin Landsman      [facility]                    MD  Fetal Care  Referred By:      Gigi Gin                    CONSTANT MD ---------------------------------------------------------------------- Orders   #  Description                          Code         Ordered By   1  [place] MFM UA CORD DOPPLER               76820.02     RAVI [facility]  ----------------------------------------------------------------------   #  Order #                    Accession #                 Episode #   1  [id number]                  [id number]                  [id number]  ---------------------------------------------------------------------- Indications   Cervical shortening complicating pregnancy     O26.879   Encounter for other antenatal screening        Z36.2   follow-up (low risk NIPS)   Obesity complicating pregnancy, second         O99.212   trimester (pregravid BMI 34.03)   Maternal care for known or suspected poor      O36.5930   fetal growth, third trimester, not applicable or   unspecified   [redacted] weeks gestation of pregnancy                Z3A.30  ---------------------------------------------------------------------- Vital Signs  Weight (lb): 193                               Height:        5'1"  BMI:         36.46 ---------------------------------------------------------------------- Fetal  Evaluation  Num Of Fetuses:          1  Fetal Heart Rate(bpm):   146  Cardiac Activity:        Observed  Presentation:            Cephalic  Placenta:                Posterior  P. Cord Insertion:       Visualized  Amniotic Fluid  AFI FV:      Within normal limits  AFI Sum(cm)     %Tile       Largest Pocket(cm)  8.37            4           5.12  RUQ(cm)       RLQ(cm)       LUQ(cm)        LLQ(cm)  3.25          0             5.12           0 ---------------------------------------------------------------------- OB History  Gravidity:    2  TOP:          1        Living:  0 ---------------------------------------------------------------------- Gestational Age  LMP:           30w 6d  Date:  04/01/18                 EDD:   01/06/19  Best:          Georgiann Hahn 6d     Det. By:  LMP  (04/01/18)          EDD:   01/06/19 ---------------------------------------------------------------------- Doppler - Fetal Vessels  Umbilical Artery   S/D     %tile                                            ADFV    RDFV  2.44       29                                                No      No ---------------------------------------------------------------------- Impression  UA Dopplers normal  Normal amniotic fluid  Prior EFW 3% ---------------------------------------------------------------------- Recommendations  Continue weekly testing with UA dopplers  Repeat growth in 2 weeks. ----------------------------------------------------------------------               Lin Landsman, MD Electronically Signed Final Report   11/05/2018 03:12 pm ----------------------------------------------------------------------   Assessment and Plan:  Pregnancy: G2P0010 at [redacted]w[redacted]d  1. Supervision of high risk pregnancy, antepartum  2. Poor fetal growth.  EFW at 2nd percentile - followed by MFM per protocol for IUGR - delivery recommended at 37 weeks  3. Screening for diabetes mellitus Rx: - Hemoglobin A1c; Future   Preterm labor symptoms and  general obstetric precautions including but not limited to vaginal bleeding, contractions, leaking of fluid and fetal movement were reviewed in detail with the patient. I discussed the assessment and treatment plan with the patient. The patient was provided an opportunity to ask questions and all were answered. The patient agreed with the plan and demonstrated an understanding of the instructions. The patient was advised to call back or seek an in-person office evaluation/go to [facility] at [facility] for any urgent or concerning symptoms. Please refer to After Visit Summary for other counseling recommendations.   I provided 10 minutes of face-to-face time during this encounter.  No follow-ups on file.  Future Appointments  Date Time Provider Department Center  12/01/2018  8:00 AM [facility] NURSE [facility]  12/01/2018  8:00 AM [facility] [place] 3 WH-MFCUS M[facility]-US    Coral Ceo, MD [facility], [facility] 11/28/2018

## 2018-12-01 ENCOUNTER — Other Ambulatory Visit (HOSPITAL_COMMUNITY): Payer: Self-pay | Admitting: *Deleted

## 2018-12-01 ENCOUNTER — Ambulatory Visit (HOSPITAL_COMMUNITY)
Admission: RE | Admit: 2018-12-01 | Discharge: 2018-12-01 | Disposition: A | Discharge status: Home / Self Care | Payer: BC Managed Care – PPO | Source: Ambulatory Visit | Attending: Obstetrics and Gynecology | Admitting: Obstetrics and Gynecology

## 2018-12-01 ENCOUNTER — Other Ambulatory Visit: Payer: Self-pay

## 2018-12-01 ENCOUNTER — Encounter (HOSPITAL_COMMUNITY): Payer: Self-pay

## 2018-12-01 ENCOUNTER — Ambulatory Visit (HOSPITAL_COMMUNITY): Payer: BC Managed Care – PPO | Admitting: *Deleted

## 2018-12-01 VITALS — BP 120/73 | HR 93 | Temp 98.6°F

## 2018-12-01 DIAGNOSIS — Z3A34 34 weeks gestation of pregnancy: Secondary | ICD-10-CM | POA: Diagnosis not present

## 2018-12-01 DIAGNOSIS — O365930 Maternal care for other known or suspected poor fetal growth, third trimester, not applicable or unspecified: Secondary | ICD-10-CM

## 2018-12-01 DIAGNOSIS — Z3403 Encounter for supervision of normal first pregnancy, third trimester: Secondary | ICD-10-CM | POA: Diagnosis not present

## 2018-12-01 DIAGNOSIS — O99213 Obesity complicating pregnancy, third trimester: Secondary | ICD-10-CM

## 2018-12-08 ENCOUNTER — Encounter (HOSPITAL_COMMUNITY): Payer: Self-pay | Admitting: *Deleted

## 2018-12-08 ENCOUNTER — Other Ambulatory Visit: Payer: Self-pay

## 2018-12-08 ENCOUNTER — Ambulatory Visit (HOSPITAL_COMMUNITY): Payer: BC Managed Care – PPO | Admitting: *Deleted

## 2018-12-08 ENCOUNTER — Ambulatory Visit (HOSPITAL_COMMUNITY)
Admission: RE | Admit: 2018-12-08 | Discharge: 2018-12-08 | Disposition: A | Discharge status: Home / Self Care | Payer: BC Managed Care – PPO | Source: Ambulatory Visit | Attending: Obstetrics and Gynecology | Admitting: Obstetrics and Gynecology

## 2018-12-08 DIAGNOSIS — O99213 Obesity complicating pregnancy, third trimester: Secondary | ICD-10-CM | POA: Diagnosis not present

## 2018-12-08 DIAGNOSIS — Z3A35 35 weeks gestation of pregnancy: Secondary | ICD-10-CM | POA: Diagnosis not present

## 2018-12-08 DIAGNOSIS — O365930 Maternal care for other known or suspected poor fetal growth, third trimester, not applicable or unspecified: Secondary | ICD-10-CM | POA: Diagnosis not present

## 2018-12-08 DIAGNOSIS — Z3403 Encounter for supervision of normal first pregnancy, third trimester: Secondary | ICD-10-CM | POA: Diagnosis not present

## 2018-12-12 ENCOUNTER — Other Ambulatory Visit: Payer: Self-pay

## 2018-12-12 ENCOUNTER — Encounter: Payer: Self-pay | Admitting: Family Medicine

## 2018-12-12 ENCOUNTER — Ambulatory Visit (INDEPENDENT_AMBULATORY_CARE_PROVIDER_SITE_OTHER): Payer: BC Managed Care – PPO | Admitting: Family Medicine

## 2018-12-12 VITALS — BP 129/87 | HR 98 | Wt 201.6 lb

## 2018-12-12 DIAGNOSIS — B9689 Other specified bacterial agents as the cause of diseases classified elsewhere: Secondary | ICD-10-CM

## 2018-12-12 DIAGNOSIS — N898 Other specified noninflammatory disorders of vagina: Secondary | ICD-10-CM | POA: Diagnosis not present

## 2018-12-12 DIAGNOSIS — Z113 Encounter for screening for infections with a predominantly sexual mode of transmission: Secondary | ICD-10-CM

## 2018-12-12 DIAGNOSIS — Z3A36 36 weeks gestation of pregnancy: Secondary | ICD-10-CM

## 2018-12-12 DIAGNOSIS — O0993 Supervision of high risk pregnancy, unspecified, third trimester: Secondary | ICD-10-CM

## 2018-12-12 DIAGNOSIS — N760 Acute vaginitis: Secondary | ICD-10-CM

## 2018-12-12 DIAGNOSIS — IMO0002 Reserved for concepts with insufficient information to code with codable children: Secondary | ICD-10-CM

## 2018-12-12 DIAGNOSIS — O0990 Supervision of high risk pregnancy, unspecified, unspecified trimester: Secondary | ICD-10-CM

## 2018-12-12 NOTE — Progress Notes (Signed)
ROB    Pt notes excessive thirst and swelling in gums.

## 2018-12-12 NOTE — Progress Notes (Signed)
   PRENATAL VISIT NOTE  Subjective:  Stephanie Woods is a 28 y.o. G2P0010 at [redacted]w[redacted]d being seen today for ongoing prenatal care.  She is currently monitored for the following issues for this low-risk pregnancy and has Other insomnia; Cigarette nicotine dependence without complication; Supervision of normal first pregnancy; and Short cervix affecting pregnancy on their problem list.  Patient reports feeling tired but otherwise no complaints.  Contractions: Irritability. Vag. Bleeding: None.  Movement: Present. Denies leaking of fluid.   The following portions of the patient's history were reviewed and updated as appropriate: allergies, current medications, past family history, past medical history, past social history, past surgical history and problem list.   Objective:   Vitals:   12/12/18 0828  BP: 129/87  Pulse: 98  Weight: 201 lb 9.6 oz (91.4 kg)    Fetal Status: Fetal Heart Rate (bpm): 152 Fundal Height: 35 cm Movement: Present  Presentation: Vertex  General:  Alert, oriented and cooperative. Patient is in no acute distress.  Skin: Skin is warm and dry. No rash noted.   Cardiovascular: Normal heart rate noted  Respiratory: Normal respiratory effort, no problems with respiration noted  Abdomen: Soft, gravid, appropriate for gestational age.  Pain/Pressure: Present     Pelvic: Cervical exam performed Dilation: Closed Effacement (%): Thick Station: -3  Extremities: Normal range of motion.  Edema: None  Mental Status: Normal mood and affect. Normal behavior. Normal judgment and thought content.   Assessment and Plan:  Pregnancy: G2P0010 at [redacted]w[redacted]d 1. Supervision of high risk pregnancy, antepartum - GBS and cervicovaginal swabs today - RTC in 1 week if MFM does not recommend induction at 37 weeks - Per patient, they will decide at appt on 10/23 whether to induce at 37 weeks or not - Girl/Breast/Nexplanon  2. Poor fetal growth - last Dopplers and BPP on 10/16 and next appt with MFM  on 10/23 - pending admission for IOL based on MFM recs on 10/23  Preterm labor symptoms and general obstetric precautions including but not limited to vaginal bleeding, contractions, leaking of fluid and fetal movement were reviewed in detail with the patient. Please refer to After Visit Summary for other counseling recommendations.   Return in about 1 week (around 12/19/2018) for [place]; patient may not need appt if she is induced at [place] but will not know until 10/23.  Future Appointments  Date Time Provider [place]  12/15/2018  8:15 AM [place] [facility]  12/15/2018  8:15 AM [facility]-MFC [place] 4 WH-MFCUS MFC-US  12/19/2018 11:15 AM Chancy Milroy, MD [facility] None  12/22/2018  8:15 AM WH-MFC NURSE WH-MFC MFC-US  12/22/2018  8:15 AM WH-MFC [place] 4 WH-MFCUS MFC-US  12/29/2018  8:15 AM WH-MFC NURSE WH-MFC MFC-US  12/29/2018  8:15 AM WH-MFC [place] 4 WH-MFCUS MFC-US    Chauncey Mann, MD

## 2018-12-13 ENCOUNTER — Telehealth: Payer: BC Managed Care – PPO

## 2018-12-13 ENCOUNTER — Other Ambulatory Visit: Payer: Self-pay

## 2018-12-13 ENCOUNTER — Encounter (HOSPITAL_COMMUNITY): Payer: Self-pay | Admitting: Emergency Medicine

## 2018-12-13 ENCOUNTER — Ambulatory Visit (HOSPITAL_COMMUNITY)
Admission: EM | Admit: 2018-12-13 | Discharge: 2018-12-13 | Disposition: A | Discharge status: Home / Self Care | Payer: BC Managed Care – PPO | Attending: Family Medicine | Admitting: Family Medicine

## 2018-12-13 DIAGNOSIS — Z20828 Contact with and (suspected) exposure to other viral communicable diseases: Secondary | ICD-10-CM | POA: Insufficient documentation

## 2018-12-13 DIAGNOSIS — J029 Acute pharyngitis, unspecified: Secondary | ICD-10-CM

## 2018-12-13 DIAGNOSIS — Z87891 Personal history of nicotine dependence: Secondary | ICD-10-CM | POA: Diagnosis not present

## 2018-12-13 DIAGNOSIS — J45909 Unspecified asthma, uncomplicated: Secondary | ICD-10-CM | POA: Diagnosis not present

## 2018-12-13 LAB — POCT RAPID STREP A: Streptococcus, Group A Screen (Direct): NEGATIVE

## 2018-12-13 NOTE — ED Provider Notes (Signed)
[facility]   [id number] 12/13/18 Arrival Time: 1206  ASSESSMENT & PLAN:  1. Sore throat     Rapid strep negative. Culture sent. No signs of abscess. Discussed.  COVID testing sent. To self-quarantine until results are available.   Discharge Instructions      You may use over the counter ibuprofen or acetaminophen as needed.  For a sore throat, over the counter products such as [employer]gate Peroxyl Mouth Sore Rinse or Chloraseptic Sore Throat Spray may provide some temporary relief.  Your rapid strep test was negative today. We have sent your throat swab for culture and will let you know of any positive results.   Reviewed expectations re: course of current medical issues. Questions answered. Outlined signs and symptoms indicating need for more acute intervention. Patient verbalized understanding. After Visit Summary given.   SUBJECTIVE:  Stephanie Woods is a 28 y.o. female who reports a sore throat. Describes as mild to moderate discomfort with swallowing. Onset abrupt beginning 1 day ago. Symptoms have gradually worsened since beginning; without voice changes. No respiratory symptoms. Normal PO intake but reports discomfort with swallowing. No specific alleviating factors. Fever reported: Fever: absent. No neck pain or swelling. No associated nausea, vomiting, or abdominal pain. Known sick contacts: none. Recent travel: none. No known COVID exposures. OTC treatment: Tylenol with some relief.  ROS: As per HPI.   OBJECTIVE:  Vitals:   12/13/18 1218  BP: 124/79  Pulse: 98  Resp: 16  Temp: 99 F (37.2 C)  TempSrc: Oral  SpO2: 98%     General appearance: alert; no distress HEENT: throat with moderate erythema; uvula is midline; tonsils have been removed Neck: supple with FROM; no lymphadenopathy CV: RRR Lungs: clear to auscultation bilaterally Abd: gravid; soft; non-tender Skin: reveals no rash; warm and dry Psychological: alert and cooperative; normal  mood and affect  No Known Allergies  Past Medical History:  Diagnosis Date  . Asthma    Social History   Socioeconomic History  . Marital status: Married    Spouse name: Not on file  . Number of children: Not on file  . Years of education: Not on file  . Highest education level: Not on file  Occupational History  . Not on file  Social Needs  . Financial resource strain: Not on file  . Food insecurity    Worry: Not on file    Inability: Not on file  . Transportation needs    Medical: Not on file    Non-medical: Not on file  Tobacco Use  . Smoking status: Former Smoker    Packs/day: 0.25    Years: 9.00    Pack years: 2.25  . Smokeless tobacco: Never Used  Substance and Sexual Activity  . Alcohol use: Yes    Comment: social  . Drug use: No  . Sexual activity: Yes    Partners: Male    Birth control/protection: None  Lifestyle  . Physical activity    Days per week: Not on file    Minutes per session: Not on file  . Stress: Not on file  Relationships  . Social Herbalist on phone: Not on file    Gets together: Not on file    Attends religious service: Not on file    Active member of club or organization: Not on file    Attends meetings of clubs or organizations: Not on file    Relationship status: Not on file  . Intimate partner violence  Fear of current or ex partner: Not on file    Emotionally abused: Not on file    Physically abused: Not on file    Forced sexual activity: Not on file  Other Topics Concern  . Not on file  Social History Narrative  . Not on file   Family History  Problem Relation Age of Onset  . Hypertension Maternal Grandmother   . Hypertension Mother   . Healthy Father           Mardella Layman, MD 12/13/18 [phone]

## 2018-12-13 NOTE — Discharge Instructions (Addendum)
You may use over the counter ibuprofen or acetaminophen as needed.  °For a sore throat, over the counter products such as Colgate Peroxyl Mouth Sore Rinse or Chloraseptic Sore Throat Spray may provide some temporary relief. °Your rapid strep test was negative today. We have sent your throat swab for culture and will let you know of any positive results. ° °If your Covid-19 test is positive, you will get a phone call from [facility] regarding your results. If your Covid-19 test is negative, you will NOT get a phone call from  with your results. You may view your results on MyChart. If you do not have a MyChart account, sign up instructions are in your discharge papers. ° °

## 2018-12-13 NOTE — ED Triage Notes (Signed)
PT reports white patchy sore throat that started this morning.

## 2018-12-14 LAB — STREP GP B NAA: Strep Gp B NAA: NEGATIVE

## 2018-12-14 LAB — NOVEL CORONAVIRUS, NAA (HOSP ORDER, SEND-OUT TO REF LAB; TAT 18-24 HRS): SARS-CoV-2, NAA: NOT DETECTED

## 2018-12-15 ENCOUNTER — Ambulatory Visit (HOSPITAL_COMMUNITY): Payer: BC Managed Care – PPO | Admitting: *Deleted

## 2018-12-15 ENCOUNTER — Other Ambulatory Visit: Payer: Self-pay

## 2018-12-15 ENCOUNTER — Other Ambulatory Visit (HOSPITAL_COMMUNITY): Payer: Self-pay | Admitting: Obstetrics

## 2018-12-15 ENCOUNTER — Ambulatory Visit (HOSPITAL_COMMUNITY)
Admission: RE | Admit: 2018-12-15 | Discharge: 2018-12-15 | Disposition: A | Discharge status: Home / Self Care | Payer: BC Managed Care – PPO | Source: Ambulatory Visit | Attending: Obstetrics and Gynecology | Admitting: Obstetrics and Gynecology

## 2018-12-15 ENCOUNTER — Encounter (HOSPITAL_COMMUNITY): Payer: Self-pay

## 2018-12-15 VITALS — BP 124/85 | HR 94 | Temp 98.1°F

## 2018-12-15 DIAGNOSIS — Z3A36 36 weeks gestation of pregnancy: Secondary | ICD-10-CM

## 2018-12-15 DIAGNOSIS — O365930 Maternal care for other known or suspected poor fetal growth, third trimester, not applicable or unspecified: Secondary | ICD-10-CM

## 2018-12-15 DIAGNOSIS — O365990 Maternal care for other known or suspected poor fetal growth, unspecified trimester, not applicable or unspecified: Secondary | ICD-10-CM

## 2018-12-15 DIAGNOSIS — Z362 Encounter for other antenatal screening follow-up: Secondary | ICD-10-CM | POA: Diagnosis not present

## 2018-12-15 DIAGNOSIS — O99213 Obesity complicating pregnancy, third trimester: Secondary | ICD-10-CM

## 2018-12-15 LAB — CULTURE, GROUP A STREP (THRC)

## 2018-12-15 NOTE — Procedures (Signed)
Stephanie Woods 10/26/1990 [redacted]w[redacted]d  Fetus A Non-Stress Test Interpretation for 12/15/18  Indication: Unsatisfactory BPP  Fetal Heart Rate A Mode: External Baseline Rate (A): 135 bpm Variability: Moderate Accelerations: 15 x 15 Decelerations: None Multiple birth?: No  Uterine Activity Mode: Palpation, Toco Contraction Frequency (min): x1 Contraction Duration (sec): 90 Contraction Quality: Mild Resting Tone Palpated: Relaxed Resting Time: Adequate  Interpretation (Fetal Testing) Nonstress Test Interpretation: Reactive Comments: Reviewed tracing with Dr. Donalee Citrin

## 2018-12-19 ENCOUNTER — Ambulatory Visit (INDEPENDENT_AMBULATORY_CARE_PROVIDER_SITE_OTHER): Payer: BC Managed Care – PPO | Admitting: Obstetrics and Gynecology

## 2018-12-19 ENCOUNTER — Encounter: Payer: Self-pay | Admitting: Obstetrics and Gynecology

## 2018-12-19 ENCOUNTER — Other Ambulatory Visit: Payer: Self-pay

## 2018-12-19 VITALS — BP 136/95 | HR 103 | Wt 203.6 lb

## 2018-12-19 DIAGNOSIS — Z3A37 37 weeks gestation of pregnancy: Secondary | ICD-10-CM

## 2018-12-19 DIAGNOSIS — O365990 Maternal care for other known or suspected poor fetal growth, unspecified trimester, not applicable or unspecified: Secondary | ICD-10-CM | POA: Insufficient documentation

## 2018-12-19 DIAGNOSIS — Z3403 Encounter for supervision of normal first pregnancy, third trimester: Secondary | ICD-10-CM

## 2018-12-19 DIAGNOSIS — O365930 Maternal care for other known or suspected poor fetal growth, third trimester, not applicable or unspecified: Secondary | ICD-10-CM

## 2018-12-19 NOTE — Progress Notes (Signed)
Subjective:  Stephanie Woods is a 28 y.o. G2P0010 at [redacted]w[redacted]d being seen today for ongoing prenatal care.  She is currently monitored for the following issues for this high-risk pregnancy and has Other insomnia; Cigarette nicotine dependence without complication; Supervision of normal first pregnancy; Short cervix affecting pregnancy; and IUGR (intrauterine growth restriction) affecting care of mother on their problem list.  Patient reports general discomforts of pregnancy.  Contractions: Irregular. Vag. Bleeding: None.  Movement: Present. Denies leaking of fluid.   The following portions of the patient's history were reviewed and updated as appropriate: allergies, current medications, past family history, past medical history, past social history, past surgical history and problem list. Problem list updated.  Objective:   Vitals:   12/19/18 1119 12/19/18 1121  BP: (!) 135/98 (!) 136/95  Pulse: (!) 103   Weight: 203 lb 9.6 oz (92.4 kg)     Fetal Status: Fetal Heart Rate (bpm): 140   Movement: Present     General:  Alert, oriented and cooperative. Patient is in no acute distress.  Skin: Skin is warm and dry. No rash noted.   Cardiovascular: Normal heart rate noted  Respiratory: Normal respiratory effort, no problems with respiration noted  Abdomen: Soft, gravid, appropriate for gestational age. Pain/Pressure: Absent     Pelvic:  Cervical exam performed        Extremities: Normal range of motion.  Edema: None  Mental Status: Normal mood and affect. Normal behavior. Normal judgment and thought content.   Urinalysis:      Assessment and Plan:  Pregnancy: G2P0010 at [redacted]w[redacted]d  1. Encounter for supervision of normal first pregnancy in third trimester Stable Labor precautions  2. Poor fetal growth affecting management of mother in third trimester, single or unspecified fetus BPP this Friday IOL this Sunday at 38 weeks  Term labor symptoms and general obstetric precautions including but not  limited to vaginal bleeding, contractions, leaking of fluid and fetal movement were reviewed in detail with the patient. Please refer to After Visit Summary for other counseling recommendations.  Return in about 4 weeks (around 01/16/2019) for PP visit 4 weeks from 12/26/2018.   Chancy Milroy, MD

## 2018-12-19 NOTE — Patient Instructions (Signed)

## 2018-12-19 NOTE — Progress Notes (Signed)
Pt presents for ROB states MFM told her she would be induced tomorrow.

## 2018-12-19 NOTE — Addendum Note (Signed)
Addended by: Chancy Milroy on: 12/19/2018 01:53 PM   Modules accepted: Orders, SmartSet

## 2018-12-20 LAB — CERVICOVAGINAL ANCILLARY ONLY
Bacterial Vaginitis (gardnerella): POSITIVE — AB
Candida Glabrata: NEGATIVE
Candida Vaginitis: NEGATIVE
Chlamydia: NEGATIVE
Comment: NEGATIVE
Comment: NEGATIVE
Comment: NEGATIVE
Comment: NEGATIVE
Comment: NEGATIVE
Comment: NORMAL
Neisseria Gonorrhea: NEGATIVE
Trichomonas: NEGATIVE

## 2018-12-20 MED ORDER — METRONIDAZOLE 0.75 % VA GEL: 1.0000 | Freq: Every day | VAGINAL | 1 refills | Status: DC

## 2018-12-20 NOTE — Addendum Note (Signed)
Addended by: Barrington Ellison on: 12/20/2018 08:03 PM   Modules accepted: Orders

## 2018-12-21 ENCOUNTER — Telehealth (HOSPITAL_COMMUNITY): Payer: Self-pay | Admitting: *Deleted

## 2018-12-21 NOTE — Telephone Encounter (Signed)
Preadmission screen  

## 2018-12-22 ENCOUNTER — Ambulatory Visit (HOSPITAL_COMMUNITY): Payer: BC Managed Care – PPO | Admitting: *Deleted

## 2018-12-22 ENCOUNTER — Ambulatory Visit (HOSPITAL_COMMUNITY)
Admission: RE | Admit: 2018-12-22 | Discharge: 2018-12-22 | Disposition: A | Discharge status: Home / Self Care | Payer: BC Managed Care – PPO | Source: Ambulatory Visit | Attending: Obstetrics and Gynecology | Admitting: Obstetrics and Gynecology

## 2018-12-22 ENCOUNTER — Encounter (HOSPITAL_COMMUNITY): Payer: Self-pay | Admitting: *Deleted

## 2018-12-22 ENCOUNTER — Other Ambulatory Visit (HOSPITAL_COMMUNITY)
Admission: RE | Admit: 2018-12-22 | Discharge: 2018-12-22 | Disposition: A | Discharge status: Home / Self Care | Payer: BC Managed Care – PPO | Source: Ambulatory Visit | Attending: Obstetrics and Gynecology | Admitting: Obstetrics and Gynecology

## 2018-12-22 ENCOUNTER — Other Ambulatory Visit: Payer: Self-pay

## 2018-12-22 DIAGNOSIS — Z20828 Contact with and (suspected) exposure to other viral communicable diseases: Secondary | ICD-10-CM | POA: Diagnosis not present

## 2018-12-22 DIAGNOSIS — O99333 Smoking (tobacco) complicating pregnancy, third trimester: Secondary | ICD-10-CM | POA: Diagnosis not present

## 2018-12-22 DIAGNOSIS — Z3403 Encounter for supervision of normal first pregnancy, third trimester: Secondary | ICD-10-CM | POA: Insufficient documentation

## 2018-12-22 DIAGNOSIS — O365930 Maternal care for other known or suspected poor fetal growth, third trimester, not applicable or unspecified: Secondary | ICD-10-CM | POA: Insufficient documentation

## 2018-12-22 DIAGNOSIS — Z3A37 37 weeks gestation of pregnancy: Secondary | ICD-10-CM

## 2018-12-22 DIAGNOSIS — O99213 Obesity complicating pregnancy, third trimester: Secondary | ICD-10-CM

## 2018-12-22 LAB — SARS CORONAVIRUS 2 (TAT 6-24 HRS): SARS Coronavirus 2: NEGATIVE

## 2018-12-22 NOTE — MAU Note (Signed)
Asymptomatic, swab collected. 

## 2018-12-24 ENCOUNTER — Inpatient Hospital Stay (HOSPITAL_COMMUNITY): Payer: BC Managed Care – PPO | Admitting: Anesthesiology

## 2018-12-24 ENCOUNTER — Inpatient Hospital Stay (HOSPITAL_COMMUNITY): Payer: BC Managed Care – PPO

## 2018-12-24 ENCOUNTER — Encounter (HOSPITAL_COMMUNITY): Payer: Self-pay | Admitting: *Deleted

## 2018-12-24 ENCOUNTER — Other Ambulatory Visit: Payer: Self-pay

## 2018-12-24 ENCOUNTER — Inpatient Hospital Stay (HOSPITAL_COMMUNITY)
Admission: AD | Admit: 2018-12-24 | Discharge: 2018-12-27 | DRG: 806 | Disposition: A | Discharge status: Home / Self Care | Payer: BC Managed Care – PPO | Attending: Obstetrics & Gynecology | Admitting: Obstetrics & Gynecology

## 2018-12-24 DIAGNOSIS — Z87891 Personal history of nicotine dependence: Secondary | ICD-10-CM | POA: Diagnosis not present

## 2018-12-24 DIAGNOSIS — Z30017 Encounter for initial prescription of implantable subdermal contraceptive: Secondary | ICD-10-CM

## 2018-12-24 DIAGNOSIS — R011 Cardiac murmur, unspecified: Secondary | ICD-10-CM | POA: Diagnosis not present

## 2018-12-24 DIAGNOSIS — Z23 Encounter for immunization: Secondary | ICD-10-CM | POA: Diagnosis not present

## 2018-12-24 DIAGNOSIS — O365930 Maternal care for other known or suspected poor fetal growth, third trimester, not applicable or unspecified: Secondary | ICD-10-CM | POA: Diagnosis not present

## 2018-12-24 DIAGNOSIS — O9952 Diseases of the respiratory system complicating childbirth: Secondary | ICD-10-CM | POA: Diagnosis not present

## 2018-12-24 DIAGNOSIS — O134 Gestational [pregnancy-induced] hypertension without significant proteinuria, complicating childbirth: Secondary | ICD-10-CM | POA: Diagnosis not present

## 2018-12-24 DIAGNOSIS — O26873 Cervical shortening, third trimester: Secondary | ICD-10-CM | POA: Diagnosis present

## 2018-12-24 DIAGNOSIS — O139 Gestational [pregnancy-induced] hypertension without significant proteinuria, unspecified trimester: Secondary | ICD-10-CM

## 2018-12-24 DIAGNOSIS — Z3A38 38 weeks gestation of pregnancy: Secondary | ICD-10-CM

## 2018-12-24 DIAGNOSIS — O365990 Maternal care for other known or suspected poor fetal growth, unspecified trimester, not applicable or unspecified: Secondary | ICD-10-CM | POA: Diagnosis present

## 2018-12-24 DIAGNOSIS — O8612 Endometritis following delivery: Secondary | ICD-10-CM | POA: Diagnosis present

## 2018-12-24 DIAGNOSIS — J45909 Unspecified asthma, uncomplicated: Secondary | ICD-10-CM | POA: Diagnosis not present

## 2018-12-24 DIAGNOSIS — K429 Umbilical hernia without obstruction or gangrene: Secondary | ICD-10-CM | POA: Diagnosis not present

## 2018-12-24 LAB — RPR: RPR Ser Ql: NONREACTIVE

## 2018-12-24 LAB — CBC
HCT: 33.1 % — ABNORMAL LOW (ref 36.0–46.0)
Hemoglobin: 10.6 g/dL — ABNORMAL LOW (ref 12.0–15.0)
MCH: 26.9 pg (ref 26.0–34.0)
MCHC: 32 g/dL (ref 30.0–36.0)
MCV: 84 fL (ref 80.0–100.0)
Platelets: 154 10*3/uL (ref 150–400)
RBC: 3.94 MIL/uL (ref 3.87–5.11)
RDW: 13.2 % (ref 11.5–15.5)
WBC: 6.8 10*3/uL (ref 4.0–10.5)
nRBC: 0 % (ref 0.0–0.2)

## 2018-12-24 MED ORDER — SODIUM CHLORIDE (PF) 0.9 % IJ SOLN
INTRAMUSCULAR | Status: DC | PRN
  Administered 2018-12-24: 12 mL/h via EPIDURAL

## 2018-12-24 MED ORDER — FENTANYL-BUPIVACAINE-NACL 0.5-0.125-0.9 MG/250ML-% EP SOLN: 12.0000 mL/h | EPIDURAL | Status: DC | PRN

## 2018-12-24 MED ORDER — LIDOCAINE HCL (PF) 1 % IJ SOLN
30.0000 mL | INTRAMUSCULAR | Status: AC | PRN
  Administered 2018-12-25: 10:00:00 30 mL via SUBCUTANEOUS
  Filled 2018-12-24: qty 30

## 2018-12-24 MED ORDER — OXYTOCIN 40 UNITS IN NORMAL SALINE INFUSION - SIMPLE MED
1.0000 m[IU]/min | INTRAVENOUS | Status: DC
  Administered 2018-12-24: 22:00:00 2 m[IU]/min via INTRAVENOUS

## 2018-12-24 MED ORDER — BUTORPHANOL TARTRATE 1 MG/ML IJ SOLN
2.0000 mg | Freq: Once | INTRAMUSCULAR | Status: AC
  Administered 2018-12-24: 2 mg via INTRAVENOUS

## 2018-12-24 MED ORDER — OXYTOCIN BOLUS FROM INFUSION
500.0000 mL | Freq: Once | INTRAVENOUS | Status: AC
  Administered 2018-12-25: 10:00:00 500 mL via INTRAVENOUS

## 2018-12-24 MED ORDER — MISOPROSTOL 50MCG HALF TABLET
50.0000 ug | ORAL_TABLET | ORAL | Status: DC
  Administered 2018-12-24 (×2): 50 ug via BUCCAL
  Filled 2018-12-24: qty 1

## 2018-12-24 MED ORDER — FENTANYL CITRATE (PF) 100 MCG/2ML IJ SOLN
50.0000 ug | INTRAMUSCULAR | Status: DC | PRN
  Administered 2018-12-24: 18:00:00 100 ug via INTRAVENOUS
  Filled 2018-12-24: qty 2

## 2018-12-24 MED ORDER — OXYCODONE-ACETAMINOPHEN 5-325 MG PO TABS: 1.0000 | ORAL_TABLET | ORAL | Status: DC | PRN

## 2018-12-24 MED ORDER — OXYTOCIN 40 UNITS IN NORMAL SALINE INFUSION - SIMPLE MED
2.5000 [IU]/h | INTRAVENOUS | Status: DC
  Filled 2018-12-24: qty 1000

## 2018-12-24 MED ORDER — PHENYLEPHRINE 40 MCG/ML (10ML) SYRINGE FOR IV PUSH (FOR BLOOD PRESSURE SUPPORT): 80.0000 ug | PREFILLED_SYRINGE | INTRAVENOUS | Status: DC | PRN

## 2018-12-24 MED ORDER — EPHEDRINE 5 MG/ML INJ: 10.0000 mg | INTRAVENOUS | Status: DC | PRN

## 2018-12-24 MED ORDER — OXYCODONE-ACETAMINOPHEN 5-325 MG PO TABS: 2.0000 | ORAL_TABLET | ORAL | Status: DC | PRN

## 2018-12-24 MED ORDER — DIPHENHYDRAMINE HCL 50 MG/ML IJ SOLN
12.5000 mg | INTRAMUSCULAR | Status: DC | PRN
  Administered 2018-12-24: 21:00:00 12.5 mg via INTRAVENOUS
  Filled 2018-12-24: qty 1

## 2018-12-24 MED ORDER — LACTATED RINGERS IV SOLN
INTRAVENOUS | Status: DC
  Administered 2018-12-24 – 2018-12-25 (×3): via INTRAVENOUS

## 2018-12-24 MED ORDER — LIDOCAINE-EPINEPHRINE (PF) 2 %-1:200000 IJ SOLN
INTRAMUSCULAR | Status: DC | PRN
  Administered 2018-12-24 (×2): 2 mL via EPIDURAL

## 2018-12-24 MED ORDER — LACTATED RINGERS IV SOLN
500.0000 mL | INTRAVENOUS | Status: DC | PRN
  Administered 2018-12-25 (×2): 500 mL via INTRAVENOUS

## 2018-12-24 MED ORDER — MISOPROSTOL 50MCG HALF TABLET
ORAL_TABLET | ORAL | Status: AC
  Filled 2018-12-24: qty 1

## 2018-12-24 MED ORDER — BUTORPHANOL TARTRATE 1 MG/ML IJ SOLN
INTRAMUSCULAR | Status: AC
  Filled 2018-12-24: qty 2

## 2018-12-24 MED ORDER — SOD CITRATE-CITRIC ACID 500-334 MG/5ML PO SOLN: 30.0000 mL | ORAL | Status: DC | PRN

## 2018-12-24 MED ORDER — FENTANYL-BUPIVACAINE-NACL 0.5-0.125-0.9 MG/250ML-% EP SOLN
EPIDURAL | Status: AC
  Filled 2018-12-24: qty 250

## 2018-12-24 MED ORDER — PHENYLEPHRINE 40 MCG/ML (10ML) SYRINGE FOR IV PUSH (FOR BLOOD PRESSURE SUPPORT)
PREFILLED_SYRINGE | INTRAVENOUS | Status: AC
  Filled 2018-12-24: qty 10

## 2018-12-24 MED ORDER — LACTATED RINGERS IV SOLN
500.0000 mL | Freq: Once | INTRAVENOUS | Status: AC
  Administered 2018-12-24: 19:00:00 500 mL via INTRAVENOUS

## 2018-12-24 MED ORDER — ONDANSETRON HCL 4 MG/2ML IJ SOLN: 4.0000 mg | Freq: Four times a day (QID) | INTRAMUSCULAR | Status: DC | PRN

## 2018-12-24 MED ORDER — ACETAMINOPHEN 325 MG PO TABS: 650.0000 mg | ORAL_TABLET | ORAL | Status: DC | PRN

## 2018-12-24 MED ORDER — TERBUTALINE SULFATE 1 MG/ML IJ SOLN: 0.2500 mg | Freq: Once | INTRAMUSCULAR | Status: DC | PRN

## 2018-12-24 NOTE — Progress Notes (Signed)
Stephanie Woods is a 28 y.o. G2P0010 at 110w1d admitted for induction of labor due to fetal growth restriction.  Subjective: Pt feeling mild cramping, s/o in room for support.  Objective: BP 122/69   Pulse 78   Temp 98.6 F (37 C) (Oral)   Resp 16   Ht 5\' 1"  (1.549 m)   Wt 93 kg   LMP 04/01/2018   BMI 38.73 kg/m  No intake/output data recorded. No intake/output data recorded.  FHT:  FHR: 130 bpm, variability: moderate,  accelerations:  Present,  decelerations:  Absent UC:   regular, every 2 minutes SVE:   Dilation: 1 Effacement (%): 50 Station: -3 Exam by:: Fatima Blank, CNM  Foley bulb inserted without difficulty.  RN filled bulb to 60 cc. Fentanyl 100 mcg IV given at pt request prior to procedure. Pt tolerated well.  Labs: Lab Results  Component Value Date   WBC 6.8 12/24/2018   HGB 10.6 (L) 12/24/2018   HCT 33.1 (L) 12/24/2018   MCV 84.0 12/24/2018   PLT 154 12/24/2018    Assessment / Plan: Induction of labor due to fetal growth restriction S/P Cytotec x 2 Foley bulb insertion at 1750  Labor: Progressing well, Cytotec held due to frequency of contractions Preeclampsia:  n/a Fetal Wellbeing:  Category I Pain Control:  IV pain meds I/D:  GBS neg Anticipated MOD:  NSVD  Fatima Blank 12/24/2018, 5:57 PM

## 2018-12-24 NOTE — Progress Notes (Addendum)
Stephanie Woods is a 28 y.o. G2P0010 at [redacted]w[redacted]d by LMP admitted for induction of labor due to fetal growth restriction.  Subjective: Pt comfortable, feeling mild cramping.  S/O in room for support.  Objective: Temp 98.1 F (36.7 C) (Oral)   Ht 5\' 1"  (1.549 m)   Wt 93 kg   LMP 04/01/2018   BMI 38.73 kg/m  No intake/output data recorded. No intake/output data recorded.  FHT:  FHR: 135 bpm, variability: moderate,  accelerations:  Present,  decelerations:  Absent UC:   irregular, every 2-5 minutes, mild to palpation SVE:   Dilation: Closed Effacement (%): 50 Station: -3 Exam by:: Epifanio Lesches, RNC  Labs: Lab Results  Component Value Date   WBC 6.8 12/24/2018   HGB 10.6 (L) 12/24/2018   HCT 33.1 (L) 12/24/2018   MCV 84.0 12/24/2018   PLT 154 12/24/2018    Assessment / Plan: Induction of labor due to [place] S/P buccal cytotec x 1 at [place]: Progressing normally Preeclampsia:  n/a Fetal Wellbeing:  Category I Pain Control:  Labor support without medications I/D:  GBS neg Anticipated MOD:  NSVD  Fatima Blank 12/24/2018, 9:49 AM

## 2018-12-24 NOTE — Anesthesia Procedure Notes (Signed)
Epidural Patient location during procedure: OB Start time: 12/24/2018 7:20 PM End time: 12/24/2018 7:35 PM  Staffing Anesthesiologist: Freddrick March, MD Performed: anesthesiologist   Preanesthetic Checklist Completed: patient identified, pre-op evaluation, timeout performed, IV checked, risks and benefits discussed and monitors and equipment checked  Epidural Patient position: sitting Prep: site prepped and draped and DuraPrep Patient monitoring: continuous pulse ox, blood pressure, heart rate and cardiac monitor Approach: midline Location: L3-L4 Injection technique: LOR air  Needle:  Needle type: Tuohy  Needle gauge: 17 G Needle length: 9 cm Needle insertion depth: 6 cm Catheter type: closed end flexible Catheter size: 19 Gauge Catheter at skin depth: 12 cm Test dose: negative  Assessment Sensory level: T8 Events: blood not aspirated, injection not painful, no injection resistance, negative IV test and no paresthesia  Additional Notes Patient identified. Risks/Benefits/Options discussed with patient including but not limited to bleeding, infection, nerve damage, paralysis, failed block, incomplete pain control, headache, blood pressure changes, nausea, vomiting, reactions to medication both or allergic, itching and postpartum back pain. Confirmed with bedside nurse the patient's most recent platelet count. Confirmed with patient that they are not currently taking any anticoagulation, have any bleeding history or any family history of bleeding disorders. Patient expressed understanding and wished to proceed. All questions were answered. Sterile technique was used throughout the entire procedure. Please see nursing notes for vital signs. Test dose was given through epidural catheter and negative prior to continuing to dose epidural or start infusion. Warning signs of high block given to the patient including shortness of breath, tingling/numbness in hands, complete motor block,  or any concerning symptoms with instructions to call for help. Patient was given instructions on fall risk and not to get out of bed. All questions and concerns addressed with instructions to call with any issues or inadequate analgesia.  Reason for block:procedure for pain

## 2018-12-24 NOTE — Anesthesia Preprocedure Evaluation (Signed)
Anesthesia Evaluation    Reviewed: Allergy & Precautions, Patient's Chart, lab work & pertinent test results  Airway Mallampati: II  TM Distance: >3 FB Neck ROM: Full    Dental no notable dental hx.    Pulmonary asthma , former smoker,    Pulmonary exam normal breath sounds clear to auscultation       Cardiovascular negative cardio ROS Normal cardiovascular exam Rhythm:Regular Rate:Normal     Neuro/Psych negative neurological ROS  negative psych ROS   GI/Hepatic negative GI ROS, Neg liver ROS,   Endo/Other  negative endocrine ROS  Renal/GU negative Renal ROS  negative genitourinary   Musculoskeletal negative musculoskeletal ROS (+)   Abdominal   Peds  Hematology negative hematology ROS (+)   Anesthesia Other Findings   Reproductive/Obstetrics (+) Pregnancy                             Anesthesia Physical Anesthesia Plan  ASA: II  Anesthesia Plan: Epidural   Post-op Pain Management:    Induction:   PONV Risk Score and Plan: Treatment may vary due to age or medical condition  Airway Management Planned: Natural Airway  Additional Equipment:   Intra-op Plan:   Post-operative Plan:   Informed Consent: I have reviewed the patients History and Physical, chart, labs and discussed the procedure including the risks, benefits and alternatives for the proposed anesthesia with the patient or authorized representative who has indicated his/her understanding and acceptance.       Plan Discussed with: Anesthesiologist  Anesthesia Plan Comments: (Patient identified. Risks, benefits, options discussed with patient including but not limited to bleeding, infection, nerve damage, paralysis, failed block, incomplete pain control, headache, blood pressure changes, nausea, vomiting, reactions to medication, itching, and post partum back pain. Confirmed with bedside nurse the patient's most recent  platelet count. Confirmed with the patient that they are not taking any anticoagulation, have any bleeding history or any family history of bleeding disorders. Patient expressed understanding and wishes to proceed. All questions were answered. )        Anesthesia Quick Evaluation

## 2018-12-24 NOTE — Progress Notes (Signed)
Stephanie Woods is a 28 y.o. G2P0010 at [redacted]w[redacted]d admitted for induction of labor due to fetal growth restriction.  Subjective: Pt feeling mild cramping, desires IV pain medication so she can sleep but reports pain is mild.  Objective: Temp 98.1 F (36.7 C) (Oral)   Ht 5\' 1"  (1.549 m)   Wt 93 kg   LMP 04/01/2018   BMI 38.73 kg/m  No intake/output data recorded. No intake/output data recorded.  FHT:  FHR: 135 bpm, variability: moderate,  accelerations:  Present,  decelerations:  Absent UC:   irregular, every 2-6 minutes SVE:   Dilation: Closed Effacement (%): Thick Station: -3 Exam by:: Epifanio Lesches, RNC  Labs: Lab Results  Component Value Date   WBC 6.8 12/24/2018   HGB 10.6 (L) 12/24/2018   HCT 33.1 (L) 12/24/2018   MCV 84.0 12/24/2018   PLT 154 12/24/2018    Assessment / Plan: Induction of labor due to growth restriction S/P Cytotec x 1, cervix closed on RN exam  Labor: Cytotec held at scheduled time due to frequency of contractions but contractions now irregular, farther apart.  Second dose of Cytotec given at this time.  Discussed Foley bulb with pt to consider PRN at next exam.  Pt wants to rest so Stadol 2 mg ordered, hold Fentanyl if desired until 2+ hours after Stadol dose. Preeclampsia:  n/a Fetal Wellbeing:  Category I Pain Control:  IV pain meds I/D:  GBS neg Anticipated MOD:  NSVD  Trinita Devlin Leftwich-Kirby 12/24/2018, 1:21 PM

## 2018-12-24 NOTE — H&P (Signed)
Stephanie Woods is a 28 y.o. female G2P0010 at [redacted]w[redacted]d presenting for IOL for fetal growth restriction with EFW on 12/15/18 of 2382 g, or 5%tile. BPP 8/8 with normal dopplers on 12/22/18.  Pregnancy was also complicated by short cervix treated with Prometrium in early pregnancy.  Otherwise, pregnancy has been uncomplicated.  She plans Nexplanon in the hospital for contraception.   Nursing Staff Provider  Office Location  [facility] Dating  LMP  Language  ENGLISH Anatomy US  incomplete  Flu Vaccine   Per pt getting at work noted  12/12/18 Genetic Screen  NIPS: low risks  AFP:     TDaP vaccine   10/17/2018 Hgb A1C or  GTT Early  Third trimester 2 hour wnl Component     Latest Ref Rng & Units 10/17/2018  Glucose, Fasting     65 - 91 mg/dL 90  Glucose, 1 hour     65 - 179 mg/dL 121  Glucose, 2 hour     65 - 152 mg/dL 121    Rhogam     LAB RESULTS   Feeding Plan BREAST  Blood Type A/Positive/-- (04/20 1437)   Contraception Nexplanon Antibody Negative (04/20 1437)  Circumcision N/a female Rubella 4.51 (04/20 1437)  Pediatrician  Undecided  RPR Non Reactive (08/25 0834)   Support Person Husband  HBsAg Negative (04/20 1437)   Prenatal Classes  HIV Non Reactive (08/25 0834)  BTL Consent  GBS --[place] (10/20 0848)  VBAC Consent  Pap  normal (01/30/2018)  Blood Pressure Cuff   Yes Hgb Electro      CF     SMA     Waterbirth  [ ]  Class [ ]  Consent [ ]  CNM visit     OB History    Gravida  2   Para      Term      Preterm      AB  1   Living  0     SAB      TAB  1   Ectopic      Multiple      Live Births             Past Medical History:  Diagnosis Date  . Asthma    Past Surgical History:  Procedure Laterality Date  . TONSILLECTOMY     Family History: family history includes Healthy in her father; Hypertension in her maternal grandmother and mother. Social History:  reports that she has quit smoking. She has a 2.25 pack-year smoking history. She has never used  smokeless tobacco. She reports current alcohol use. She reports that she does not use drugs.     Maternal Diabetes: No Genetic Screening: Normal Maternal Ultrasounds/Referrals: IUGR Fetal Ultrasounds or other Referrals:  None Maternal Substance Abuse:  No Significant Maternal Medications:  None Significant Maternal Lab Results:  Group B Strep negative Other Comments:  None  Review of Systems  Constitutional: Negative for chills, fever and malaise/fatigue.  Eyes: Negative for blurred vision.  Respiratory: Negative for cough and shortness of breath.   Cardiovascular: Negative for chest pain.  Gastrointestinal: Negative for heartburn and vomiting.  Genitourinary: Negative for dysuria, frequency and urgency.  Musculoskeletal: Negative.   Neurological: Negative for dizziness and headaches.  Psychiatric/Behavioral: Negative for depression.   Maternal Medical History:  Reason for admission: Induction for growth restriction, EFW less than 5%tile  Contractions: Frequency: irregular.   Perceived severity is mild.    Fetal activity: Perceived fetal activity is normal.   Last  perceived fetal movement was within the past hour.    Prenatal complications: IUGR.   Prenatal Complications - Diabetes: none.    Dilation: Closed Effacement (%): 50 Station: -3 Exam by:: Welford Roche, RNC Temperature 98.1 F (36.7 C), temperature source Oral, height 5\' 1"  (1.549 m), weight 93 kg, last menstrual period 04/01/2018. Maternal Exam:  Uterine Assessment: Contraction strength is mild.  Contraction frequency is irregular.   Abdomen: Patient reports no abdominal tenderness. Estimated fetal weight is 2382g on 12/15/18.   Fetal presentation: vertex  Cervix: Cervix evaluated by digital exam.     Fetal Exam Fetal Monitor Review: Mode: ultrasound.   Baseline rate: 135.  Variability: moderate (6-25 bpm).   Pattern: accelerations present and no decelerations.    Fetal State Assessment:  Category I - tracings are normal.     Physical Exam  Nursing note and vitals reviewed. Constitutional: She is oriented to person, place, and time. She appears well-developed and well-nourished.  Neck: Normal range of motion.  Cardiovascular: Normal rate, regular rhythm and normal heart sounds.  Respiratory: Effort normal and breath sounds normal.  GI: Soft.  Musculoskeletal: Normal range of motion.  Neurological: She is alert and oriented to person, place, and time.  Skin: Skin is warm and dry.  Psychiatric: She has a normal mood and affect. Her behavior is normal. Judgment and thought content normal.    Prenatal labs: ABO, Rh: --/--/A POS, A POS Performed at [facility], [address], [place], [place]  [id number]) Antibody: NEG (11/01 0734) Rubella: 4.51 (04/20 1437) RPR: Non Reactive (08/25 0834)  HBsAg: Negative (04/20 1437)  HIV: Non Reactive (08/25 0834)  GBS: --02-19-1981 (10/20 0848)   Assessment/Plan: 28 y.o. G2P0010 at [redacted]w[redacted]d IOL for fetal growth restriction, EFW 5%tile with normal dopplers GBS negative  Admit to L&D Cytotec 50 mcg buccally Q 4 hours IV pain medication PRN Anticipate SVD  [redacted]w[redacted]d 12/24/2018, 9:53 AM

## 2018-12-25 ENCOUNTER — Encounter (HOSPITAL_COMMUNITY): Payer: Self-pay | Admitting: *Deleted

## 2018-12-25 DIAGNOSIS — O365930 Maternal care for other known or suspected poor fetal growth, third trimester, not applicable or unspecified: Secondary | ICD-10-CM

## 2018-12-25 DIAGNOSIS — Z3A38 38 weeks gestation of pregnancy: Secondary | ICD-10-CM

## 2018-12-25 LAB — COMPREHENSIVE METABOLIC PANEL
ALT: 19 U/L (ref 0–44)
AST: 28 U/L (ref 15–41)
Albumin: 2.5 g/dL — ABNORMAL LOW (ref 3.5–5.0)
Alkaline Phosphatase: 98 U/L (ref 38–126)
Anion gap: 10 (ref 5–15)
BUN: <5 mg/dL — ABNORMAL LOW (ref 6–20)
CO2: 20 mmol/L — ABNORMAL LOW (ref 22–32)
Calcium: 8.6 mg/dL — ABNORMAL LOW (ref 8.9–10.3)
Chloride: 108 mmol/L (ref 98–111)
Creatinine, Ser: 0.71 mg/dL (ref 0.44–1.00)
GFR calc Af Amer: >60 mL/min (ref >60)
GFR calc non Af Amer: >60 mL/min (ref >60)
Glucose, Bld: 115 mg/dL — ABNORMAL HIGH (ref 70–99)
Potassium: 2.8 mmol/L — ABNORMAL LOW (ref 3.5–5.1)
Sodium: 138 mmol/L (ref 135–145)
Total Bilirubin: 0.8 mg/dL (ref 0.3–1.2)
Total Protein: 5.8 g/dL — ABNORMAL LOW (ref 6.5–8.1)

## 2018-12-25 LAB — CBC
HCT: 32.9 % — ABNORMAL LOW (ref 36.0–46.0)
Hemoglobin: 10.3 g/dL — ABNORMAL LOW (ref 12.0–15.0)
MCH: 26.5 pg (ref 26.0–34.0)
MCHC: 31.3 g/dL (ref 30.0–36.0)
MCV: 84.8 fL (ref 80.0–100.0)
Platelets: 141 10*3/uL — ABNORMAL LOW (ref 150–400)
RBC: 3.88 MIL/uL (ref 3.87–5.11)
RDW: 13.3 % (ref 11.5–15.5)
WBC: 13.5 10*3/uL — ABNORMAL HIGH (ref 4.0–10.5)
nRBC: 0 % (ref 0.0–0.2)

## 2018-12-25 LAB — TYPE AND SCREEN
ABO/RH(D): A POS
Antibody Screen: NEGATIVE

## 2018-12-25 LAB — PROTEIN / CREATININE RATIO, URINE
Creatinine, Urine: 21.73 mg/dL
Total Protein, Urine: <6 mg/dL

## 2018-12-25 LAB — ABO/RH: ABO/RH(D): A POS

## 2018-12-25 MED ORDER — CLINDAMYCIN PHOSPHATE 900 MG/50ML IV SOLN
900.0000 mg | Freq: Three times a day (TID) | INTRAVENOUS | Status: AC
  Administered 2018-12-25 – 2018-12-26 (×3): 900 mg via INTRAVENOUS
  Filled 2018-12-25 (×4): qty 50

## 2018-12-25 MED ORDER — COCONUT OIL OIL
1.0000 "application " | TOPICAL_OIL | Status: DC | PRN
  Administered 2018-12-26: 1 via TOPICAL

## 2018-12-25 MED ORDER — MAGNESIUM HYDROXIDE 400 MG/5ML PO SUSP: 30.0000 mL | ORAL | Status: DC | PRN

## 2018-12-25 MED ORDER — IBUPROFEN 600 MG PO TABS
600.0000 mg | ORAL_TABLET | Freq: Four times a day (QID) | ORAL | Status: DC
  Administered 2018-12-25 – 2018-12-27 (×8): 600 mg via ORAL
  Filled 2018-12-25 (×8): qty 1

## 2018-12-25 MED ORDER — ACETAMINOPHEN 325 MG PO TABS
650.0000 mg | ORAL_TABLET | ORAL | Status: DC | PRN
  Administered 2018-12-25: 650 mg via ORAL
  Filled 2018-12-25: qty 2

## 2018-12-25 MED ORDER — SIMETHICONE 80 MG PO CHEW: 80.0000 mg | CHEWABLE_TABLET | ORAL | Status: DC | PRN

## 2018-12-25 MED ORDER — ETONOGESTREL 68 MG ~~LOC~~ IMPL
68.0000 mg | DRUG_IMPLANT | Freq: Once | SUBCUTANEOUS | Status: AC
  Administered 2018-12-26: 10:00:00 68 mg via SUBCUTANEOUS
  Filled 2018-12-25: qty 1

## 2018-12-25 MED ORDER — DIPHENHYDRAMINE HCL 25 MG PO CAPS: 25.0000 mg | ORAL_CAPSULE | Freq: Four times a day (QID) | ORAL | Status: DC | PRN

## 2018-12-25 MED ORDER — IBUPROFEN 600 MG PO TABS: 600.0000 mg | ORAL_TABLET | Freq: Once | ORAL | Status: DC

## 2018-12-25 MED ORDER — GENTAMICIN SULFATE 40 MG/ML IJ SOLN
5.0000 mg/kg | INTRAVENOUS | Status: AC
  Administered 2018-12-25: 15:00:00 330 mg via INTRAVENOUS
  Filled 2018-12-25: qty 8.25

## 2018-12-25 MED ORDER — ONDANSETRON HCL 4 MG PO TABS: 4.0000 mg | ORAL_TABLET | ORAL | Status: DC | PRN

## 2018-12-25 MED ORDER — BENZOCAINE-MENTHOL 20-0.5 % EX AERO: 1.0000 "application " | INHALATION_SPRAY | CUTANEOUS | Status: DC | PRN

## 2018-12-25 MED ORDER — DIBUCAINE (PERIANAL) 1 % EX OINT: 1.0000 "application " | TOPICAL_OINTMENT | CUTANEOUS | Status: DC | PRN

## 2018-12-25 MED ORDER — PRENATAL MULTIVITAMIN CH
1.0000 | ORAL_TABLET | Freq: Every day | ORAL | Status: DC
  Filled 2018-12-25 (×2): qty 1

## 2018-12-25 MED ORDER — ONDANSETRON HCL 4 MG/2ML IJ SOLN: 4.0000 mg | INTRAMUSCULAR | Status: DC | PRN

## 2018-12-25 MED ORDER — POTASSIUM CHLORIDE CRYS ER 20 MEQ PO TBCR
40.0000 meq | EXTENDED_RELEASE_TABLET | Freq: Two times a day (BID) | ORAL | Status: DC
  Administered 2018-12-25 – 2018-12-26 (×2): 40 meq via ORAL
  Filled 2018-12-25 (×3): qty 2

## 2018-12-25 MED ORDER — LIDOCAINE HCL 1 % IJ SOLN
0.0000 mL | Freq: Once | INTRAMUSCULAR | Status: AC | PRN
  Administered 2018-12-26: 10:00:00 20 mL via INTRADERMAL
  Filled 2018-12-25 (×2): qty 20

## 2018-12-25 MED ORDER — WITCH HAZEL-GLYCERIN EX PADS: 1.0000 "application " | MEDICATED_PAD | CUTANEOUS | Status: DC | PRN

## 2018-12-25 MED ORDER — MISOPROSTOL 200 MCG PO TABS
ORAL_TABLET | ORAL | Status: AC
  Administered 2018-12-25: 10:00:00 800 ug
  Filled 2018-12-25: qty 5

## 2018-12-25 MED ORDER — FERROUS SULFATE 325 (65 FE) MG PO TABS
325.0000 mg | ORAL_TABLET | Freq: Two times a day (BID) | ORAL | Status: DC
  Filled 2018-12-25 (×2): qty 1

## 2018-12-25 NOTE — Discharge Instructions (Signed)

## 2018-12-25 NOTE — Lactation Note (Signed)
This note was copied from a baby's chart. Lactation Consultation Note  Patient Name: Stephanie Woods OIZTI'W Date: 12/25/2018 Reason for consult: Early term 37-38.6wks;Primapara;1st time breastfeeding  P1 mother whose infant is now 54 hours old.  This is an ETI at 38+2 weeks.  MD was finishing assessment when I arrived and mother was happy to see me for lactation assistance.  Baby was not showing feeding cues but was awake and alert.  Mother's breasts are large, soft and non tender and nipples are everted and intact.  Assisted baby to latch in the football hold on the right breast easily.  Demonstrated breast compressions and gentle stimulation.  Baby began rhythmic sucking and continued to suck without further stimulation.  Mother had initial sensitivity which eased as baby continued to suck.  Mother informed me that her nipples are usually sensitive.  Encouraged to express colostrum and rub into nipples/areolas after feedings followed by coconut oil.  Mother will obtain coconut oil from RN.  Encouraged to feed on cue or every three hours due to baby being an ETI.  Suggested mother practice hand expression before/after feedings and to feed back any EBM she obtains with hand expression.  Colostrum container provided and milk storage times reviewed.  Finger feeding demonstrated.  Mom made aware of O/P services, breastfeeding support groups, community resources, and our phone # for post-discharge questions.  Mother has a DEBP for home use.  Father present.  Mother will call for latch assistance as needed.     Maternal Data Formula Feeding for Exclusion: No Has patient been taught Hand Expression?: Yes Does the patient have breastfeeding experience prior to this delivery?: No  Feeding Feeding Type: Breast Fed  LATCH Score Latch: Grasps breast easily, tongue down, lips flanged, rhythmical sucking.  Audible Swallowing: None  Type of Nipple: Everted at rest and after  stimulation  Comfort (Breast/Nipple): Soft / non-tender  Hold (Positioning): Assistance needed to correctly position infant at breast and maintain latch.  LATCH Score: 7  Interventions Interventions: Breast feeding basics reviewed;Assisted with latch;Skin to skin;Breast massage;Hand express;Breast compression;Adjust position;Position options;Support pillows  Lactation Tools Discussed/Used     Consult Status Consult Status: Follow-up Date: 12/26/18 Follow-up type: In-patient    Little Ishikawa 12/25/2018, 6:19 PM

## 2018-12-25 NOTE — Progress Notes (Signed)
LABOR PROGRESS NOTE  Stephanie Woods is a 28 y.o. G2P0010 at [redacted]w[redacted]d  admitted for IOL due to fetal growth restriction.  Subjective: Patient is feeling pressure at this time.  Objective: BP 114/81   Pulse 90   Temp 98.3 F (36.8 C) (Oral)   Resp 18   Ht 5\' 1"  (1.549 m)   Wt 93 kg   LMP 04/01/2018   SpO2 99%   BMI 38.73 kg/m  or  Vitals:   12/25/18 0600 12/25/18 0630 12/25/18 0655 12/25/18 0700  BP: (!) 107/59 103/65  114/81  Pulse: 89 90    Resp: 18 17  18   Temp:      TempSrc:      SpO2:   99% 99%  Weight:      Height:       Dilation: 10 Dilation Complete Date: 12/25/18 Dilation Complete Time: 0740 Effacement (%): 80 Station: Plus 1, Plus 2 Presentation: Vertex Exam by:: Dr Caron Presume FHT: baseline rate 130, moderate varibility, + acel, variable decelerations Toco: 2-4 min   Labs: Lab Results  Component Value Date   WBC 6.8 12/24/2018   HGB 10.6 (L) 12/24/2018   HCT 33.1 (L) 12/24/2018   MCV 84.0 12/24/2018   PLT 154 12/24/2018    Patient Active Problem List   Diagnosis Date Noted  . IUGR (intrauterine growth restriction) affecting care of mother 12/19/2018  . Short cervix affecting pregnancy 09/26/2018  . Supervision of normal first pregnancy 06/12/2018  . Cigarette nicotine dependence without complication 30/16/0109  . Other insomnia 04/13/2018    Assessment / Plan: 28 y.o. G2P0010 at [redacted]w[redacted]d here for IOL because of fetal growth restriction.  Labor: Patient is complete at this time and is practiced pushing with nurse Fetal Wellbeing: Category 2, variables with pushing but recovers after contraction Pain Control: Epidural in place Anticipated MOD: Vaginal delivery  Gifford Shave, MD  PGY-1, [facility]  12/25/2018, 7:52 AM

## 2018-12-25 NOTE — Progress Notes (Signed)
Stephanie Woods is a 28 y.o. G2P0010 at [redacted]w[redacted]d   Subjective: Tired but comfortable with epidural. Pushing well with contractions. Denies specific requests/birth plan at this time  Objective: BP 114/81   Pulse 90   Temp 98.3 F (36.8 C) (Oral)   Resp 18   Ht 5\' 1"  (1.549 m)   Wt 93 kg   LMP 04/01/2018   SpO2 99%   BMI 38.73 kg/m  I/O last 3 completed shifts: In: 1429.8 [I.V.:1429.8] Out: 1325 [Urine:1325] No intake/output data recorded.  FHT:  FHR: 125 bpm, variability: moderate,  accelerations:  Present,  decelerations:  Absent UC:   regular, every 2-3 minutes SVE:   Dilation: 10 Effacement (%): 80 Station: Plus 1, Plus 2 Exam by:: Dr Caron Presume  Labs: Lab Results  Component Value Date   WBC 6.8 12/24/2018   HGB 10.6 (L) 12/24/2018   HCT 33.1 (L) 12/24/2018   MCV 84.0 12/24/2018   PLT 154 12/24/2018    Assessment / Plan: - At bedside to introduce myself patient, review birth plan PRN - Pushing initiated around 0730, pushing effectively with contractions, descent noted per RN  - Epidural infusing and effective - Anticipate NSVD   Darlina Rumpf, CNM 12/25/2018, 8:35 AM

## 2018-12-25 NOTE — Progress Notes (Signed)
ANTIBIOTIC CONSULT NOTE - INITIAL  Pharmacy Consult for Gentamicin Indication: Fever 100.7 following SVD   No Known Allergies  Patient Measurements: Height: 5\' 1"  (154.9 cm) Weight: 205 lb (93 kg) IBW/kg (Calculated) : 47.8 Adjusted Body Weight: 65.9 kg  Vital Signs: Temp: 100.7 F (38.2 C) (11/02 1142) Temp Source: Axillary (11/02 1142) BP: 150/99 (11/02 1139) Pulse Rate: 97 (11/02 1139)  Labs: Recent Labs    12/24/18 0734  WBC 6.8  HGB 10.6*  PLT 154   No results for input(s): GENTTROUGH, GENTPEAK, GENTRANDOM in the last 72 hours.   Microbiology: Recent Results (from the past 720 hour(s))  Strep Gp B NAA     Status: None   Collection Time: 12/12/18  8:48 AM   Specimen: Genital   VR  Result Value Ref Range Status   Strep Gp B NAA Negative Negative Final    Comment: Centers for Disease Control and Prevention (CDC) and American Congress of Obstetricians and Gynecologists (ACOG) guidelines for prevention of perinatal group B streptococcal (GBS) disease specify co-collection of a vaginal and rectal swab specimen to maximize sensitivity of GBS detection. Per the CDC and ACOG, swabbing both the lower vagina and rectum substantially increases the yield of detection compared with sampling the vagina alone. Penicillin G, ampicillin, or cefazolin are indicated for intrapartum prophylaxis of perinatal GBS colonization. Reflex susceptibility testing should be performed prior to use of clindamycin only on GBS isolates from penicillin-allergic women who are considered a high risk for anaphylaxis. Treatment with vancomycin without additional testing is warranted if resistance to clindamycin is noted.   Culture, group A strep (throat)     Status: None   Collection Time: 12/13/18 12:51 PM   Specimen: Throat  Result Value Ref Range Status   Specimen Description THROAT  Final   Special Requests NONE  Final   Culture   Final    NO GROUP A STREP (S.PYOGENES) ISOLATED Performed  at [facility], [place] [address] [place], [place] [address]    Report Status 12/15/2018 FINAL  Final  Novel Coronavirus, NAA (Hosp order, Send-out to Ref Lab; TAT 18-24 hrs     Status: None   Collection Time: 12/13/18 12:57 PM   Specimen: Nasopharyngeal Swab; Respiratory  Result Value Ref Range Status   SARS-CoV-2, NAA NOT DETECTED NOT DETECTED Final    Comment: (NOTE) This nucleic acid amplification test was developed and its performance characteristics determined by [employer]. Nucleic acid amplification tests include PCR and TMA. This test has not been FDA cleared or approved. This test has been authorized by FDA under an Emergency Use Authorization (EUA). This test is only authorized for the duration of time the declaration that circumstances exist justifying the authorization of the emergency use of in vitro diagnostic tests for detection of SARS-CoV-2 virus and/or diagnosis of COVID-19 infection under section 564(b)(1) of the Act, 21 U.S.C. 782NFA-2(Z) (1), unless the authorization is terminated or revoked sooner. When diagnostic testing is negative, the possibility of a false negative result should be considered in the context of a patient's recent exposures and the presence of clinical signs and symptoms consistent with COVID-19. An individual without symptoms of COVID- 19 and who is not shedding SARS-CoV-2 vi rus would expect to have a negative (not detected) result in this assay. Performed At: [facility] [address] [place], [place] [id number] Rush Farmer MD NG:[id number]    [place]  Final    Comment: Performed at [facility], [address] [place], [place] [address]  SARS CORONAVIRUS 2 (TAT 6-24 HRS) Nasopharyngeal Nasopharyngeal Swab     Status: None   Collection Time: 12/22/18  7:32 AM   Specimen: Nasopharyngeal Swab  Result Value Ref Range Status   SARS Coronavirus 2 NEGATIVE NEGATIVE Final     Comment: (NOTE) SARS-CoV-2 target nucleic acids are NOT DETECTED. The SARS-CoV-2 RNA is generally detectable in upper and lower respiratory specimens during the acute phase of infection. Negative results do not preclude SARS-CoV-2 infection, do not rule out co-infections with other pathogens, and should not be used as the sole basis for treatment or other patient management decisions. Negative results must be combined with clinical observations, patient history, and epidemiological information. The expected result is Negative. Fact Sheet for Patients: HairSlick.no Fact Sheet for Healthcare Providers: quierodirigir.com This test is not yet approved or cleared by the [place] FDA and  has been authorized for detection and/or diagnosis of SARS-CoV-2 by FDA under an Emergency Use Authorization (EUA). This EUA will remain  in effect (meaning this test can be used) for the duration of the COVID-19 declaration under Section 56 4(b)(1) of the Act, 21 U.S.C. section 360bbb-3(b)(1), unless the authorization is terminated or revoked sooner. Performed at [facility], [address], [place], [place] [address]     Medications:  Clindamycin 900mg  IV q8hr x 3 doses  Assessment: 28 y.o. female G2P1011 at [redacted]w[redacted]d delivered ~ 5hr prior. Pt spiked fever 100.7 following SVD   Goal of Therapy:  Gentamicin peak 20-25mg /L and Trough < 1 mg/L  Plan:  Gentamicin 330 mg (5mg /kg) IV every 24 hrs x one dose.   [redacted]w[redacted]d 12/25/2018,12:08 PM

## 2018-12-25 NOTE — Anesthesia Postprocedure Evaluation (Signed)
Anesthesia Post Note  Patient: Stephanie Woods  Procedure(s) Performed: AN AD HOC LABOR EPIDURAL     Patient location during evaluation: Mother Baby Anesthesia Type: Epidural Level of consciousness: awake Pain management: satisfactory to patient Vital Signs Assessment: post-procedure vital signs reviewed and stable Respiratory status: spontaneous breathing Cardiovascular status: stable Anesthetic complications: no    Last Vitals:  Vitals:   12/25/18 1430 12/25/18 1505  BP: (!) 143/87 (!) 141/98  Pulse: 89 87  Resp:  20  Temp:    SpO2:      Last Pain:  Vitals:   12/25/18 1736  TempSrc:   PainSc: 2    Pain Goal:                   Casimer Lanius

## 2018-12-25 NOTE — Progress Notes (Signed)
Post Partum Day 0  Subjective: Patient feeling tired but happy to be postpartum. Denies headache, visual disturbances, RUQ/epigastric pain, weakness or syncope.  Objective: Blood pressure (!) 138/99, pulse 82, temperature 99.5 F (37.5 C), temperature source Oral, resp. rate 18, height 5\' 1"  (1.549 m), weight 93 kg, last menstrual period 04/01/2018, SpO2 100 %, unknown if currently breastfeeding.  Physical Exam:  General: alert, cooperative, appears stated age and no distress Lochia: appropriate Uterine Fundus: firm Incision: N/A DVT Evaluation: No evidence of DVT seen on physical exam.  Recent Labs    12/24/18 0734 12/25/18 1229  HGB 10.6* 10.3*  HCT 33.1* 32.9*    Assessment/Plan: --At bedside to ensure patient understanding of BP monitoring, labs, hypokalemia --Patient agreeable --RN at bedside during my visit, reviewed  New orders  Darlina Rumpf, CNM 12/25/2018, 2:06 PM

## 2018-12-25 NOTE — Plan of Care (Signed)
  Problem: Education: Goal: Knowledge of Childbirth will improve Outcome: Progressing Goal: Ability to make informed decisions regarding treatment and plan of care will improve Outcome: Progressing Goal: Ability to state and carry out methods to decrease the pain will improve Outcome: Progressing Goal: Individualized Educational Video(s) Outcome: Progressing   Problem: Coping: Goal: Ability to verbalize concerns and feelings about labor and delivery will improve Outcome: Progressing   Problem: Life Cycle: Goal: Ability to make normal progression through stages of labor will improve Outcome: Progressing   Problem: Role Relationship: Goal: Will demonstrate positive interactions with the child Outcome: Progressing   Problem: Safety: Goal: Risk of complications during labor and delivery will decrease Outcome: Progressing   Problem: Pain Management: Goal: Relief or control of pain from uterine contractions will improve Outcome: Progressing   Problem: Education: Goal: Knowledge of General Education information will improve Description: Including pain rating scale, medication(s)/side effects and non-pharmacologic comfort measures Outcome: Progressing   Problem: Health Behavior/Discharge Planning: Goal: Ability to manage health-related needs will improve Outcome: Progressing   Problem: Clinical Measurements: Goal: Ability to maintain clinical measurements within normal limits will improve Outcome: Progressing Goal: Will remain free from infection Outcome: Progressing Goal: Diagnostic test results will improve Outcome: Progressing Goal: Respiratory complications will improve Outcome: Progressing Goal: Cardiovascular complication will be avoided Outcome: Progressing   Problem: Activity: Goal: Risk for activity intolerance will decrease Outcome: Progressing   Problem: Nutrition: Goal: Adequate nutrition will be maintained Outcome: Progressing   Problem: Coping: Goal:  Level of anxiety will decrease Outcome: Progressing   Problem: Elimination: Goal: Will not experience complications related to bowel motility Outcome: Progressing Goal: Will not experience complications related to urinary retention Outcome: Progressing   Problem: Pain Managment: Goal: General experience of comfort will improve Outcome: Progressing   Problem: Safety: Goal: Ability to remain free from injury will improve Outcome: Progressing   Problem: Skin Integrity: Goal: Risk for impaired skin integrity will decrease Outcome: Progressing

## 2018-12-25 NOTE — Progress Notes (Signed)
LABOR PROGRESS NOTE  Stephanie Woods is a 28 y.o. G2P0010 at [redacted]w[redacted]d  admitted for IOL due to fetal growth restriction.  Subjective: Patient is not feeling well at this time.  She SROM and immediately vomited.  She has nausea at this time.  Objective: BP 125/79   Pulse (!) 109   Temp 98.6 F (37 C) (Oral)   Resp 18   Ht 5\' 1"  (1.549 m)   Wt 93 kg   LMP 04/01/2018   SpO2 99%   BMI 38.73 kg/m  or  Vitals:   12/25/18 0300 12/25/18 0311 12/25/18 0330 12/25/18 0400  BP: 105/70 118/68 126/74 125/79  Pulse: (!) 104 (!) 105 (!) 107 (!) 109  Resp: 17 18 18 18   Temp:      TempSrc:      SpO2:      Weight:      Height:       Dilation: 5.5 Effacement (%): 70 Station: -1 Presentation: Vertex Exam by:: Stephanie Hail, RN FHT: baseline rate 130, moderate varibility, + acel, occasional variable decel Toco: 2-4 min   Labs: Lab Results  Component Value Date   WBC 6.8 12/24/2018   HGB 10.6 (L) 12/24/2018   HCT 33.1 (L) 12/24/2018   MCV 84.0 12/24/2018   PLT 154 12/24/2018    Patient Active Problem List   Diagnosis Date Noted  . IUGR (intrauterine growth restriction) affecting care of mother 12/19/2018  . Short cervix affecting pregnancy 09/26/2018  . Supervision of normal first pregnancy 06/12/2018  . Cigarette nicotine dependence without complication 62/94/7654  . Other insomnia 04/13/2018    Assessment / Plan: 28 y.o. G2P0010 at [redacted]w[redacted]d here for IOL because of fetal growth restriction.  Labor: Progressing well, SROM done last check.  Pitocin currently at 15 mL an hour.  Will reassess in 3 to 4 hours. Fetal Wellbeing: Category 2, reassuring given variability as well as accelerations. Pain Control: Epidural in place Anticipated MOD: Vaginal delivery  Gifford Shave, MD  PGY-1, [facility]  12/25/2018, 4:41 AM

## 2018-12-25 NOTE — Progress Notes (Signed)
Voicemail left for bedside RN regarding blood pressure checks, new order for K-Dur, active order for P:Cr via straight cath.  Mallie Snooks, MSN, CNM Certified Nurse Midwife, [employer] for [employer], [employer] 12/25/18 1:43 PM

## 2018-12-25 NOTE — Discharge Summary (Signed)
  Postpartum Discharge Summary     Patient Name: Stephanie Woods DOB: 05/05/1990 MRN: [id number]  Date of admission: 12/24/2018 Delivering Provider: WEINHOLD, SAMANTHA C   Date of discharge: 12/27/2018  Admitting diagnosis: PREG Intrauterine pregnancy: [redacted]w[redacted]d     Secondary diagnosis:  Active Problems:   IUGR (intrauterine growth restriction) affecting care of mother   Gestational hypertension  Additional problems: None     Discharge diagnosis: Term Pregnancy Delivered                                                                                                Post partum procedures:N/A  Augmentation: Pitocin, Cytotec and Foley Balloon  Complications: None  Hospital course:  Induction of Labor With Vaginal Delivery   28 y.o. yo G2P0010 at [redacted]w[redacted]d was admitted to the hospital 12/24/2018 for induction of labor.  Indication for induction: FGR.  Patient had an uncomplicated labor course as follows: Membrane Rupture Time/Date: 4:08 AM ,12/25/2018   Intrapartum Procedures: Episiotomy: None [1] N/A                                        Lacerations:  2nd degree [3] 2nd degree perineal with repair Patient had delivery of a Viable infant.  Information for the patient's newborn:  Allard, Girl Nayeli [[id number]]  Delivery Method: Vaginal, Spontaneous(Filed from Delivery Summary)    12/25/2018  Details of delivery can be found in separate delivery note.  Patient had a routine postpartum course. Patient is discharged home 12/27/18. Delivery time: 9:44 AM    Magnesium Sulfate received: No BMZ received: No Rhophylac:No MMR:No Transfusion:No  Physical exam  Vitals:   12/26/18 2100 12/27/18 0544 12/27/18 0748 12/27/18 1407  BP: (!) 125/96 (!) 132/101 125/88 (!) 130/92  Pulse: 100 85 85 89  Resp: 18 18  18  Temp: 97.8 F (36.6 C) 98 F (36.7 C)    TempSrc: Oral Oral    SpO2: 100% 100%    Weight:      Height:       General: alert, cooperative and no distress Lochia:  appropriate Uterine Fundus: firm Incision: N/A DVT Evaluation: No evidence of DVT seen on physical exam. Negative Homan's sign. No cords or calf tenderness. No significant calf/ankle edema. Labs: Lab Results  Component Value Date   WBC 7.7 12/27/2018   HGB 10.2 (L) 12/27/2018   HCT 32.2 (L) 12/27/2018   MCV 84.1 12/27/2018   PLT 170 12/27/2018   CMP Latest Ref Rng & Units 12/27/2018  Glucose 70 - 99 mg/dL 104(H)  BUN 6 - 20 mg/dL 6  Creatinine 0.44 - 1.00 mg/dL 0.62  Sodium 135 - 145 mmol/L 137  Potassium 3.5 - 5.1 mmol/L 3.5  Chloride 98 - 111 mmol/L 109  CO2 22 - 32 mmol/L 19(L)  Calcium 8.9 - 10.3 mg/dL 8.5(L)  Total Protein 6.5 - 8.1 g/dL 5.4(L)  Total Bilirubin 0.3 - 1.2 mg/dL 0.3  Alkaline Phos 38 - 126 U/L 84  AST 15 - 41 U/L 25    ALT 0 - 44 U/L 20    Discharge instruction: per After Visit Summary and "Baby and Me Booklet".  After visit meds:  Allergies as of 12/27/2018   No Known Allergies     Medication List    STOP taking these medications   diphenhydrAMINE 25 MG tablet Commonly known as: BENADRYL   metroNIDAZOLE 0.75 % vaginal gel Commonly known as: METROGEL   pantoprazole 40 MG tablet Commonly known as: Protonix   progesterone 200 MG capsule Commonly known as: Prometrium   promethazine 25 MG tablet Commonly known as: PHENERGAN     TAKE these medications   acetaminophen 325 MG tablet Commonly known as: TYLENOL Take 650 mg by mouth every 6 (six) hours as needed.   amLODipine 10 MG tablet Commonly known as: NORVASC Take 1 tablet (10 mg total) by mouth daily. Start taking on: December 28, 2018   ibuprofen 600 MG tablet Commonly known as: ADVIL Take 1 tablet (600 mg total) by mouth every 6 (six) hours.   PRENATAL VITAMINS PO Take 1 tablet by mouth daily.       Diet: routine diet  Activity: Advance as tolerated. Pelvic rest for 6 weeks.   Outpatient follow up:on Monday 01/01/2019 for BP recheck, prn and 01/29/2019 for postpartum  visit Follow up Appt: Future Appointments  Date Time Provider [place]  01/01/2019  2:15 PM [place] None  01/29/2019 10:00 AM Sloan Leiter, MD [place] None   Follow up Visit: [place]. Go on 01/01/2019.   Specialty: Obstetrics and Gynecology Why: As scheduled Contact information: [address], [place] [phone]         Message sent to clinic by S. Jeronimo Greaves, CNM on 12/25/2018  Please schedule this patient for Postpartum visit in: 4 weeks with the following provider: Any provider For C/S patients schedule nurse incision check in weeks 2 weeks: no High risk pregnancy complicated by: FGR Delivery mode:  SVD Anticipated Birth Control:  Nexplanon PP Procedures needed: BP check  Schedule Integrated [place] visit: no  Newborn Data: Live born female "[place]" Birth Weight:   APGAR: 2, 4  Newborn Delivery   Birth date/time: 12/25/2018 09:44:00 Delivery type:       Baby Feeding: Breast Disposition:home with mother   Laury [place], [place]  12/27/2018 2:41 PM

## 2018-12-26 DIAGNOSIS — Z30017 Encounter for initial prescription of implantable subdermal contraceptive: Secondary | ICD-10-CM

## 2018-12-26 LAB — BASIC METABOLIC PANEL
Anion gap: 9 (ref 5–15)
BUN: <5 mg/dL — ABNORMAL LOW (ref 6–20)
CO2: 18 mmol/L — ABNORMAL LOW (ref 22–32)
Calcium: 8.7 mg/dL — ABNORMAL LOW (ref 8.9–10.3)
Chloride: 111 mmol/L (ref 98–111)
Creatinine, Ser: 0.65 mg/dL (ref 0.44–1.00)
GFR calc Af Amer: >60 mL/min (ref >60)
GFR calc non Af Amer: >60 mL/min (ref >60)
Glucose, Bld: 111 mg/dL — ABNORMAL HIGH (ref 70–99)
Potassium: 3.5 mmol/L (ref 3.5–5.1)
Sodium: 138 mmol/L (ref 135–145)

## 2018-12-26 MED ORDER — AMLODIPINE BESYLATE 5 MG PO TABS
5.0000 mg | ORAL_TABLET | Freq: Once | ORAL | Status: AC
  Administered 2018-12-26: 19:00:00 5 mg via ORAL
  Filled 2018-12-26: qty 1

## 2018-12-26 MED ORDER — AMLODIPINE BESYLATE 5 MG PO TABS
5.0000 mg | ORAL_TABLET | Freq: Every day | ORAL | Status: DC
  Administered 2018-12-26: 12:00:00 5 mg via ORAL
  Filled 2018-12-26: qty 1

## 2018-12-26 MED ORDER — AMLODIPINE BESYLATE 5 MG PO TABS
10.0000 mg | ORAL_TABLET | Freq: Every day | ORAL | Status: DC
  Administered 2018-12-27: 10:00:00 10 mg via ORAL
  Filled 2018-12-26: qty 2

## 2018-12-26 MED ORDER — POTASSIUM CHLORIDE CRYS ER 20 MEQ PO TBCR
40.0000 meq | EXTENDED_RELEASE_TABLET | Freq: Once | ORAL | Status: AC
  Administered 2018-12-26: 40 meq via ORAL
  Filled 2018-12-26: qty 2

## 2018-12-26 NOTE — Progress Notes (Addendum)
POSTPARTUM PROGRESS NOTE  Post Partum Day 1  Subjective:  Stephanie Woods is a 28 y.o. G2P1011 s/p NSVD at [redacted]w[redacted]d.  She reports she is doing well. No acute events overnight. She denies any problems with ambulating, voiding or po intake. Denies nausea or vomiting.  Pain is well controlled.  Lochia is appropriate.  Objective: Blood pressure 128/77, pulse 88, temperature 98.2 F (36.8 C), temperature source Oral, resp. rate 20, height 5\' 1"  (1.549 m), weight 93 kg, last menstrual period 04/01/2018, SpO2 100 %, unknown if currently breastfeeding.  Physical Exam:  General: alert, cooperative and no distress Chest: no respiratory distress Heart:regular rate, distal pulses intact Abdomen: soft, nontender,  Uterine Fundus: firm, appropriately tender DVT Evaluation: No calf swelling or tenderness Extremities: no edema Skin: warm, dry  Recent Labs    12/24/18 0734 12/25/18 1229  HGB 10.6* 10.3*  HCT 33.1* 32.9*    Assessment/Plan: Stephanie Woods is a 28 y.o. G2P1011 s/p NSVD at [redacted]w[redacted]d   PPD#1 - Doing well  Routine postpartum care  Would like to go home but BP's borderline Contraception: Nexplanon today Feeding: breast Dispo: Plan for discharge today if BP's improve  GHTN: borderline BP's, will monitor this afternoon and if remain normal can d/c, if not understands plan is to stay for monitoring.  PP Endomyometritis: afebrile, s/p abx  Hypokalemia: s/p K supplementation, recheck BMP today   LOS: 2 days   Augustin Coupe, MD/[facility] OB Fellow  12/26/2018, 7:56 AM   Addendum: Reviewed BP's. Consistently elevated overnight and again this morning. Amlodipine 5 mg initiated. If blood pressures are better-controlled throughout the rest of the day and baby able to discharge; I think it is reasonable to discharge mom. She has a BP cuff at home and upcoming BP check. Discussed risk of post-partum Pre-E especially around PPD#5. BMP reviewed: K now normal. Gave one more dose of Kdur 82mEq and  then discontinued orders.  Barrington Ellison, MD [facility] Fellow, [facility] for [employer], [place]

## 2018-12-26 NOTE — Progress Notes (Signed)
Post Partum Day 1 Subjective: Patient verbalizes frustration with her FOB, who she states is pressuring her to discharge today so that she can celebrate her birthday.  Patient is declining medication due to feeling as if it is "all just too much". She states she is uncomfortable taking her iron and PNV in particular due to nausea.  Objective: Blood pressure (!) 128/94, pulse 85, temperature 98.2 F (36.8 C), temperature source Oral, resp. rate 20, height 5\' 1"  (1.549 m), weight 93 kg, last menstrual period 04/01/2018, SpO2 100 %, unknown if currently breastfeeding.   Recent Labs    12/24/18 0734 12/25/18 1229  HGB 10.6* 10.3*  HCT 33.1* 32.9*   Patient Vitals for the past 24 hrs:  BP Temp Temp src Pulse Resp SpO2  12/26/18 1228 (!) 128/94 - - 85 - -  12/26/18 1025 (!) 133/99 98.2 F (36.8 C) Oral - 20 -  12/26/18 0515 128/77 98.2 F (36.8 C) Oral 88 20 -  12/25/18 1925 (!) 153/93 98.5 F (36.9 C) Oral 84 18 100 %  12/25/18 1505 (!) 141/98 - - 87 20 -  12/25/18 1430 (!) 143/87 - - 89 - -  12/25/18 1400 (!) 138/99 99.5 F (37.5 C) Oral 82 18 100 %    Assessment/Plan: --Social rounding per patient request when I attended her delivery yesterday. Physical exam now performed --Praised patient for taking her Amlodipine --Discussed indications for advised/prescribed medications and how they minimize her chances of postpartum complications. Pt verbalizes understanding --Discussed justification for encouraging patient to discharge tomorrow vs late this evening --Patient amenable to medications, discharge tomorrow   LOS: 2 days   Darlina Rumpf, CNM 12/26/2018, 1:00 PM

## 2018-12-26 NOTE — Procedures (Addendum)
   PROCEDURE NOTE  Stephanie Woods is a 28 y.o. G2P1011 now PPD#1 who desires Nexplanon.  Nexplanon Insertion Procedure Patient identified, informed consent performed, consent signed.   Patient does understand that irregular bleeding is a very common side effect of this medication. She was advised to have backup contraception for one week after placement. Appropriate time out taken.  Patient's left arm was prepped and draped in the usual sterile fashion. The ruler used to measure and mark insertion area.  Patient was prepped with alcohol swab and then injected with 3 ml of 1% lidocaine.  She was prepped with betadine, Nexplanon removed from packaging,  Device confirmed in needle, then inserted full length of needle and withdrawn per handbook instructions. Nexplanon was able to palpated in the patient's arm; patient palpated the insert herself. There was minimal blood loss.  Patient insertion site covered with guaze and a pressure bandage to reduce any bruising.  The patient tolerated the procedure well and was given post procedure instructions.   Barrington Ellison, MD [facility] Family Medicine Fellow, [facility] for [employer], [facility]

## 2018-12-26 NOTE — Lactation Note (Signed)
This note was copied from a baby's chart. Lactation Consultation Note  Patient Name: Stephanie Woods Date: 12/26/2018 Reason for consult: Follow-up assessment;Early term 37-38.6wks;Infant weight loss  29 hours old ETI female who is being exclusively BF by her mother, she's a P3. Mom reported that baby's MD will be updating baby's birth weight to be at 6-08 oz. Due to the fact that most likely, original birth weight was inaccurate. Baby will possibly be dropping < 6 lbs tomorrow, LC set mom up with a DEBP, instructions, cleaning and storage were reviewed as well as milk storage guidelines.   Offered assistance with latch, but mom politely declined stating that baby just had a feeding and was asleep on mother's bed, she fed for 14 minutes. Asked mom to call for assistance when needed. Per mom, feedings at the breast are comfortable, and she can feel baby tugging, she has LATCH scores of 8 and 9. She brought her [place] pump from home, but she'll be using the DEBP from the hospital during her hospital stay, she also brought lanolin, but advised her to use her own colostrum instead, or coconut oil if she needed something "extra", her RN has already brought it in the room.  Mom able to express colostrum very easily, explained to her that baby will start getting supplemented with her own EBM since the Dr would like to withhold formula supplementation for today, she voiced understanding. Reviewed normal newborn behavior, feeding cues, cluster feeding and lactogenesis II.  Feeding plan:  1. Encouraged mom to feed baby STS 8-12 times/24 hours or sooner if feeding cues are present 2. She'll start pumping every 3 hours and will offer baby any amount of EBM she may get, using a spoon or a curve tip syringe  Parents requested all questions and concerns were answered, they're both aware of [place] OP services and will call PRN.  Maternal Data    Feeding Feeding Type: Breast Fed  LATCH Score Latch:  Grasps breast easily, tongue down, lips flanged, rhythmical sucking.  Audible Swallowing: Spontaneous and intermittent  Type of Nipple: Everted at rest and after stimulation  Comfort (Breast/Nipple): Filling, red/small blisters or bruises, mild/mod discomfort  Hold (Positioning): No assistance needed to correctly position infant at breast.  LATCH Score: 9  Interventions Interventions: Breast feeding basics reviewed;Hand express;DEBP;Coconut oil  Lactation Tools Discussed/Used Tools: Pump;Coconut oil Breast pump type: Double-Electric Breast Pump Pump Review: Setup, frequency, and cleaning;Milk Storage Initiated by:: MPeck Date initiated:: 12/26/18   Consult Status Consult Status: Follow-up Date: 12/27/18 Follow-up type: In-patient    Stephanie Woods 12/26/2018, 5:47 PM

## 2018-12-26 NOTE — Lactation Note (Signed)
This note was copied from a baby's chart. Lactation Consultation Note  Patient Name: Stephanie Woods [id number] Date: 12/26/2018 Reason for consult: Follow-up assessment;Early term 37-38.6wks P1, 16 hour female infant. Nurse changed void diaper while in room. Per mom, infant is awake and latching to breast. Mom latched infant on left breast using cross cradle hold, LC repeatedly asked mom keep infant parallel with breast and tummy to tummy, mom was turing infant to close to nipple. Infant latched without difficulty worked on infant sustain latch with good body position and hand support of neck and head, infant breastfed for 7 minutes. LC reviewed hand expression and infant was given 3 ml of colostrum by spoon. Mom will continue to work on latching infant to breast. Mom knows to call Nurse or [place] if she has any questions, concerns or need assistance  with latching infant to breast.  Mom knows to breastfeed infant according to hunger cues, 8 to 12 times within 24 hours.  Maternal Data    Feeding Feeding Type: Breast Fed  LATCH Score Latch: Grasps breast easily, tongue down, lips flanged, rhythmical sucking.  Audible Swallowing: Spontaneous and intermittent  Type of Nipple: Everted at rest and after stimulation  Comfort (Breast/Nipple): Soft / non-tender  Hold (Positioning): Assistance needed to correctly position infant at breast and maintain latch.  LATCH Score: 9  Interventions Interventions: Assisted with latch;Adjust position;Support pillows;Skin to skin;Position options;Hand express;Breast compression  Lactation Tools Discussed/Used     Consult Status Consult Status: Follow-up Date: 12/26/18 Follow-up type: In-patient    Vicente Serene 12/26/2018, 2:25 AM

## 2018-12-27 DIAGNOSIS — O139 Gestational [pregnancy-induced] hypertension without significant proteinuria, unspecified trimester: Secondary | ICD-10-CM

## 2018-12-27 LAB — COMPREHENSIVE METABOLIC PANEL
ALT: 20 U/L (ref 0–44)
AST: 25 U/L (ref 15–41)
Albumin: 2.4 g/dL — ABNORMAL LOW (ref 3.5–5.0)
Alkaline Phosphatase: 84 U/L (ref 38–126)
Anion gap: 9 (ref 5–15)
BUN: 6 mg/dL (ref 6–20)
CO2: 19 mmol/L — ABNORMAL LOW (ref 22–32)
Calcium: 8.5 mg/dL — ABNORMAL LOW (ref 8.9–10.3)
Chloride: 109 mmol/L (ref 98–111)
Creatinine, Ser: 0.62 mg/dL (ref 0.44–1.00)
GFR calc Af Amer: >60 mL/min (ref >60)
GFR calc non Af Amer: >60 mL/min (ref >60)
Glucose, Bld: 104 mg/dL — ABNORMAL HIGH (ref 70–99)
Potassium: 3.5 mmol/L (ref 3.5–5.1)
Sodium: 137 mmol/L (ref 135–145)
Total Bilirubin: 0.3 mg/dL (ref 0.3–1.2)
Total Protein: 5.4 g/dL — ABNORMAL LOW (ref 6.5–8.1)

## 2018-12-27 LAB — CBC
HCT: 32.2 % — ABNORMAL LOW (ref 36.0–46.0)
Hemoglobin: 10.2 g/dL — ABNORMAL LOW (ref 12.0–15.0)
MCH: 26.6 pg (ref 26.0–34.0)
MCHC: 31.7 g/dL (ref 30.0–36.0)
MCV: 84.1 fL (ref 80.0–100.0)
Platelets: 170 10*3/uL (ref 150–400)
RBC: 3.83 MIL/uL — ABNORMAL LOW (ref 3.87–5.11)
RDW: 13.4 % (ref 11.5–15.5)
WBC: 7.7 10*3/uL (ref 4.0–10.5)
nRBC: 0 % (ref 0.0–0.2)

## 2018-12-27 LAB — PROTEIN / CREATININE RATIO, URINE
Creatinine, Urine: 46.69 mg/dL
Total Protein, Urine: <6 mg/dL

## 2018-12-27 MED ORDER — IBUPROFEN 600 MG PO TABS: 600.0000 mg | ORAL_TABLET | Freq: Four times a day (QID) | ORAL | 0 refills | Status: DC

## 2018-12-27 MED ORDER — AMLODIPINE BESYLATE 10 MG PO TABS: 10.0000 mg | ORAL_TABLET | Freq: Every day | ORAL | 1 refills | Status: DC

## 2018-12-27 MED FILL — IBUPROFEN 600 MG TABLET: 600 | 8 days supply | Qty: 30 | Fill #0

## 2018-12-27 MED FILL — AMLODIPINE BESYLATE 10 MG T: 10 | 30 days supply | Qty: 30 | Fill #0

## 2018-12-27 NOTE — Progress Notes (Signed)
Discussed with patient the need to repeat PEC labs and await more blood pressure readings before considering d/c home. Will consult with Dr. Si Raider once results and BPs are done.   LOS: 3 days   Laury Deep 12/27/2018, 10:05 AM

## 2018-12-27 NOTE — Lactation Note (Signed)
This note was copied from a baby's chart. Lactation Consultation Note  Patient Name: Girl Bitania Shankland [id number] Date: 12/27/2018 Reason for consult: Follow-up assessment   P1, Baby 2 hours old and mother has started supplementing w/ formula due to lack of sleep per mother when baby was cluster feeding. Feed on demand with cues.  Goal 8-12+ times per day after first 24 hrs.  Place baby STS if not cueing.  Reviewed engorgement care and monitoring voids/stools.    Maternal Data    Feeding Feeding Type: Formula  LATCH Score                   Interventions Interventions: Breast feeding basics reviewed;DEBP  Lactation Tools Discussed/Used     Consult Status Consult Status: Complete Date: 12/27/18    Vivianne Master [facility] 12/27/2018, 8:39 AM

## 2018-12-27 NOTE — Progress Notes (Signed)
Patient ID: Stephanie Woods, female   DOB: 1990-05-10, 28 y.o.   MRN: [id number]  Consult with Dr. Si Raider @ 1427 - notified of patient's lab results, BP readings and patient's follow-up appts, tx plan d/c home with follow-up plan as previously prescribed - ok to d/c home, agrees with plan.  Stephanie Woods, CNM  12/27/2018 2:30 PM

## 2018-12-29 ENCOUNTER — Ambulatory Visit (HOSPITAL_COMMUNITY): Payer: BC Managed Care – PPO

## 2019-01-01 ENCOUNTER — Ambulatory Visit (INDEPENDENT_AMBULATORY_CARE_PROVIDER_SITE_OTHER): Payer: BC Managed Care – PPO

## 2019-01-01 VITALS — BP 127/79

## 2019-01-01 DIAGNOSIS — Z0130 Encounter for examination of blood pressure without abnormal findings: Secondary | ICD-10-CM

## 2019-01-01 NOTE — Progress Notes (Signed)
Patient seen and assessed by nursing staff during this encounter. I have reviewed the chart and agree with the documentation and plan.  Mora Bellman, MD 01/01/2019 3:58 PM

## 2019-01-01 NOTE — Progress Notes (Signed)
Contacted with pt through telephone. Pt denies headache, dizziness, & headache. Bp pretty good today at 127/79.  She is still taking Amlodipine 10 mg daily. Will route to provider. -EH/RMA

## 2019-01-04 ENCOUNTER — Inpatient Hospital Stay (HOSPITAL_COMMUNITY)
Admission: AD | Admit: 2019-01-04 | Discharge: 2019-01-04 | Disposition: A | Discharge status: Home / Self Care | Payer: BC Managed Care – PPO | Attending: Obstetrics and Gynecology | Admitting: Obstetrics and Gynecology

## 2019-01-04 ENCOUNTER — Other Ambulatory Visit: Payer: Self-pay

## 2019-01-04 ENCOUNTER — Encounter (HOSPITAL_COMMUNITY): Payer: Self-pay

## 2019-01-04 DIAGNOSIS — O9089 Other complications of the puerperium, not elsewhere classified: Secondary | ICD-10-CM | POA: Insufficient documentation

## 2019-01-04 NOTE — MAU Note (Signed)
Pt states that Thursday or Friday last week her sanitary pad got stuck to her vaginal stitches. Pt states that she carefully removed the pad, but thinks one of the stitches went with it.   Pt states that she has seen bright red bleeding since then. Pt reports that she thinks she has stopped vaginal bleeding and that the blood is coming from the place where the missing stitch is.   Pt reports lower abdominal cramping and butt pain today took ibuprofen and that helped.

## 2019-01-04 NOTE — Discharge Instructions (Signed)
Vaginal Laceration  A vaginal laceration is a cut or tear of the opening of the vagina, the inside of the vaginal canal, or the skin between the vaginal opening and the anus (perineum). What are the causes? This condition may be caused by:  Childbirth. It may also be caused by tools that are used to help deliver a baby, such as forceps.  Sex.  An injury from sports, bike riding, or other activities.  Thinning, dryness, or irritation of the vagina due to low estrogen levels (vulvovaginal atrophy). What increases the risk? You are more likely to develop this condition if you:  Give birth vaginally.  Are sexually active.  Have gone through menopause.  Have low estrogen levels due to certain medicines, breast cancer treatments, or breastfeeding. What are the signs or symptoms? Symptoms of this condition include:  Slight to heavy vaginal bleeding.  Vaginal swelling.  Mild to severe pain.  Vaginal tenderness.  Painful urination.  Pain or discomfort during sex. How is this diagnosed? If the tear happened during childbirth, your health care provider can diagnose the tear at that time. Other tears or lacerations can be diagnosed with your medical history and a physical exam. You may have other tests, including:  Blood tests to check your hormone levels and blood loss.  Imaging tests, such as an ultrasonogram or CT scan, to rule out other health issues, such as enlarged lymph nodes or tumors. How is this treated? Treatment depends on the severity of the tear or laceration. Minor injuries may heal on their own. If needed, this condition may be treated with:  Stitches (sutures).  Medicines, such as: ? Creams to reduce pain. ? Vaginal lubricants to treat vaginal dryness. ? Topical or oral hormonal therapy. ? Antibiotics. These may be taken orally or given as ointments to prevent or treat infection. Surgery may be needed if the tear is severe. Follow these instructions at  home: Wound care  Follow instructions from your health care provider about how to take care of your wound. Make sure you: ? Wash your hands with soap and water before and after you change your bandage (dressing). If soap and water are not available, use hand sanitizer. ? Change your dressing as told by your health care provider. ? Keep the area clean. ? Leave sutures in place, if this applies. These skin closures may need to stay in place for 2 weeks or longer.  Check your wound every day for signs of infection. Check for: ? Redness, swelling, or pain. ? Fluid or blood. ? Warmth. ? Pus or a bad smell. Managing pain and swelling   If directed, put ice on the injured area: ? Put ice in a plastic bag. ? Place a towel between your skin and the bag. ? Leave the ice on for 20 minutes, 2-3 times a day. Medicines  Take or apply over-the-counter and prescription medicines only as told by your health care provider.  If you were prescribed an antibiotic medicine, take it as told by your health care provider. Do not stop using the antibiotic even if you start to feel better.  Ask your health care provider if the medicine prescribed to you: ? Requires you to avoid driving or using heavy machinery. ? Can cause constipation. You may need to take actions to prevent or treat constipation, such as:  Drink enough fluid to keep your urine pale yellow.  Take over-the-counter or prescription medicines.  Eat foods that are high in fiber, such as beans,  whole grains, and fresh fruits and vegetables.  Limit foods that are high in fat and processed sugars, such as fried or sweet foods. General instructions   Take a sitz bath 2-3 times a day or as told by your health care provider. A sitz bath is a shallow, warm water bath that is taken while you are sitting down. The water should only come up to your hips and should cover your buttocks.  Avoid sitting or standing for long periods of time.  Lie on  your side while sleeping or resting.  Avoid straining during bowel movements. Ask your health care provider if a stool softener is needed.  Do not douche, use a tampon, or have sex until your health care provider approves.  Keep all follow-up visits as told by your health care provider. This is important. Contact a health care provider if:  You have: ? More redness, swelling, or pain in the vaginal area. ? More fluid or blood coming from your vaginal tear. ? Pus or a bad smell coming from your vaginal area. ? A fever. ? A tear that breaks open after it healed or was repaired. ? A burning pain when you urinate.  You continue to have pain during sex after the tear heals.  You are urinating more often than usual or feel an increased urgency to urinate. Get help right away if you:  Feel light-headed.  Have nausea or vomiting.  Have severe pain around your vagina or in your pelvis or lower abdomen.  Have heavy vaginal bleeding, or you are soaking more than 1 pad an hour. Summary  A vaginal laceration is a cut or tear of the opening of the vagina, the inside of the vaginal canal, or the skin between the vaginal opening and the anus (perineum).  It is caused by childbirth, sex, injury, or thinning, dryness, or irritation of the vagina due to low estrogen levels.  Treatment depends on the severity of the tear or laceration. Minor injuries may heal on their own. It may be treated with stitches, medicines, or surgery if the tear is severe. This information is not intended to replace advice given to you by your health care provider. Make sure you discuss any questions you have with your health care provider. Document Released: 02/08/2005 Document Revised: 09/22/2017 Document Reviewed: 09/22/2017 Elsevier Patient Education  2020 Elsevier Inc.  

## 2019-01-04 NOTE — MAU Provider Note (Signed)
First Provider Initiated Contact with Patient 01/04/19 1529     S Ms. Stephanie Woods is a 28 y.o. G41P1011 postpartum female who presents to MAU today with complaint of a "popped stitch" from her perineal laceration. She feels that she is bleeding from the location where her suture used to be. She is s/p SVD on 12/25/18 and had a second degree perineal laceration.   O BP 122/78   Pulse 95   Temp 98.5 F (36.9 C)   Resp 16   Wt 84.6 kg   SpO2 99%   BMI 35.23 kg/m    Patient Vitals for the past 24 hrs:  BP Temp Pulse Resp SpO2 Weight  01/04/19 1553 113/76 - 84 - - -  01/04/19 1509 122/78 - 95 - - -  01/04/19 1507 122/78 98.5 F (36.9 C) 96 16 99 % 84.6 kg    Physical Exam  Nursing note and vitals reviewed. Constitutional: She is oriented to person, place, and time. She appears well-developed and well-nourished.  Cardiovascular: Normal rate.  Respiratory: Effort normal and breath sounds normal.  GI: Soft.  Neurological: She is alert and oriented to person, place, and time.  Skin: Skin is warm and dry.  Psychiatric: She has a normal mood and affect. Her behavior is normal. Judgment and thought content normal.  Scant lochia. Loose strand of suture visualized protruding from vaginal tissue at site of perineal lac.  A Postpartum Medical screening exam complete Suture eased from tissue without difficulty, tolerated well by patient Lochia appropriate for ten days postpartum  P Discharge from MAU in stable condition Patient may return to MAU as needed for pregnancy related complaints  F/U: PP visit 01/29/19 [place], [place], [place] 01/04/2019 5:09 PM

## 2019-01-17 ENCOUNTER — Encounter: Payer: Self-pay | Admitting: Internal Medicine

## 2019-01-17 ENCOUNTER — Other Ambulatory Visit: Payer: Self-pay

## 2019-01-17 ENCOUNTER — Emergency Department (HOSPITAL_COMMUNITY)
Admission: EM | Admit: 2019-01-17 | Discharge: 2019-01-17 | Disposition: A | Discharge status: Home / Self Care | Payer: BC Managed Care – PPO | Attending: Emergency Medicine | Admitting: Emergency Medicine

## 2019-01-17 ENCOUNTER — Encounter (HOSPITAL_COMMUNITY): Payer: Self-pay | Admitting: Emergency Medicine

## 2019-01-17 DIAGNOSIS — J45909 Unspecified asthma, uncomplicated: Secondary | ICD-10-CM | POA: Insufficient documentation

## 2019-01-17 DIAGNOSIS — U071 COVID-19: Secondary | ICD-10-CM | POA: Insufficient documentation

## 2019-01-17 DIAGNOSIS — R519 Headache, unspecified: Secondary | ICD-10-CM | POA: Insufficient documentation

## 2019-01-17 DIAGNOSIS — Z79899 Other long term (current) drug therapy: Secondary | ICD-10-CM | POA: Diagnosis not present

## 2019-01-17 DIAGNOSIS — Z87891 Personal history of nicotine dependence: Secondary | ICD-10-CM | POA: Insufficient documentation

## 2019-01-17 DIAGNOSIS — J029 Acute pharyngitis, unspecified: Secondary | ICD-10-CM | POA: Diagnosis present

## 2019-01-17 LAB — INFLUENZA PANEL BY PCR (TYPE A & B)
Influenza A By PCR: NEGATIVE
Influenza B By PCR: NEGATIVE

## 2019-01-17 LAB — POC SARS CORONAVIRUS 2 AG -  ED: SARS Coronavirus 2 Ag: POSITIVE — AB

## 2019-01-17 LAB — GROUP A STREP BY PCR: Group A Strep by PCR: NOT DETECTED

## 2019-01-17 MED ORDER — ACETAMINOPHEN 325 MG PO TABS
650.0000 mg | ORAL_TABLET | Freq: Once | ORAL | Status: AC | PRN
  Administered 2019-01-17: 21:00:00 650 mg via ORAL
  Filled 2019-01-17: qty 2

## 2019-01-17 NOTE — Discharge Instructions (Addendum)
Please contact your pediatrician in the morning regarding care of your infant during your infection period, and maintain close contact with them.   Return to the emergency department with any new shortness of breath, significant chest pain, or new symptoms of concern.  Treat your fever and aches with Tylenol.

## 2019-01-17 NOTE — ED Provider Notes (Signed)
[facility]-EMERGENCY DEPT Provider Note   CSN: [id number] Arrival date & time: 01/17/19  2048     History   Chief Complaint Chief Complaint  Patient presents with  . Headache  . Fever    HPI Stephanie Woods is a 28 y.o. female.     Patient to ED with symptoms of sore throat, headache, fever, aches x 1-2 days. She denies cough, chest pain, abdominal pain or SOB. No known sick contacts. She has a history of strep throat, last episode was one year ago. She is eating and drinking without difficulty. No nausea or vomiting.   The history is provided by the patient. No language interpreter was used.  Headache Associated symptoms: congestion, fever, myalgias and sore throat   Associated symptoms: no abdominal pain, no cough, no diarrhea, no nausea and no vomiting   Fever Associated symptoms: congestion, headaches, myalgias and sore throat   Associated symptoms: no chest pain, no cough, no diarrhea, no nausea and no vomiting     Past Medical History:  Diagnosis Date  . Asthma     Patient Active Problem List   Diagnosis Date Noted  . Gestational hypertension 12/27/2018  . IUGR (intrauterine growth restriction) affecting care of mother 12/19/2018  . Short cervix affecting pregnancy 09/26/2018  . Supervision of normal first pregnancy 06/12/2018  . Cigarette nicotine dependence without complication 04/16/2018  . Other insomnia 04/13/2018    Past Surgical History:  Procedure Laterality Date  . TONSILLECTOMY       OB History    Gravida  2   Para  1   Term  1   Preterm      AB  1   Living  1     SAB      TAB  1   Ectopic      Multiple  0   Live Births  1            Home Medications    Prior to Admission medications   Medication Sig Start Date End Date Taking? Authorizing Provider  ibuprofen (ADVIL) 600 MG tablet Take 1 tablet (600 mg total) by mouth every 6 (six) hours. 12/27/18  Yes Raelyn Mora, CNM  pantoprazole  (PROTONIX) 40 MG tablet Take 40 mg by mouth daily as needed for indigestion. 01/02/19  Yes [provider]  Prenatal Vit-Fe Fumarate-FA (PRENATAL VITAMINS PO) Take 1 tablet by mouth daily.    Yes [provider]  acetaminophen (TYLENOL) 325 MG tablet Take 650 mg by mouth every 6 (six) hours as needed.    [provider]  amLODipine (NORVASC) 10 MG tablet Take 1 tablet (10 mg total) by mouth daily. Patient not taking: Reported on 01/17/2019 12/28/18   Raelyn Mora, CNM    Family History Family History  Problem Relation Age of Onset  . Hypertension Maternal Grandmother   . Hypertension Mother   . Healthy Father     Social History Social History   Tobacco Use  . Smoking status: Former Smoker    Packs/day: 0.25    Years: 9.00    Pack years: 2.25  . Smokeless tobacco: Never Used  Substance Use Topics  . Alcohol use: Not Currently    Comment: social  . Drug use: No     Allergies   Patient has no known allergies.   Review of Systems Review of Systems  Constitutional: Positive for fever.  HENT: Positive for congestion and sore throat. Negative for trouble swallowing.  Respiratory: Negative for cough and shortness of breath.   Cardiovascular: Negative for chest pain.  Gastrointestinal: Negative for abdominal pain, diarrhea, nausea and vomiting.  Musculoskeletal: Positive for myalgias.  Neurological: Positive for headaches.     Physical Exam Updated Vital Signs BP 131/81 (BP Location: Left Arm)   Pulse 95   Temp (!) 102.1 F (38.9 C) (Oral)   Resp 18   Ht 5\' 4"  (1.626 m)   Wt 83.9 kg   SpO2 96%   BMI 31.76 kg/m   Physical Exam Vitals signs and nursing note reviewed.  Constitutional:      Appearance: She is well-developed.  HENT:     Head: Normocephalic.     Mouth/Throat:     Comments: Oropharynx erythematous without significant swelling or exudates. Uvula midline. Neck:     Musculoskeletal: Normal range of motion and neck  supple.  Cardiovascular:     Rate and Rhythm: Normal rate and regular rhythm.  Pulmonary:     Effort: Pulmonary effort is normal.     Breath sounds: Normal breath sounds. No wheezing, rhonchi or rales.  Abdominal:     General: Bowel sounds are normal.     Palpations: Abdomen is soft.     Tenderness: There is no abdominal tenderness. There is no guarding or rebound.  Musculoskeletal: Normal range of motion.  Skin:    General: Skin is warm and dry.  Neurological:     Mental Status: She is alert and oriented to person, place, and time.      ED Treatments / Results  Labs (all labs ordered are listed, but only abnormal results are displayed) Labs Reviewed  GROUP A STREP BY PCR  POC SARS CORONAVIRUS 2 AG -  ED    EKG None  Radiology No results found.  Procedures Procedures (including critical care time)  Medications Ordered in ED Medications  acetaminophen (TYLENOL) tablet 650 mg (650 mg Oral Given 01/17/19 2124)     Initial Impression / Assessment and Plan / ED Course  I have reviewed the triage vital signs and the nursing notes.  Pertinent labs & imaging results that were available during my care of the patient were reviewed by me and considered in my medical decision making (see chart for details).        Patient to ED with symptoms of ST, fever, headache, body aches x 1-2 days. No cough, or SOB. No known COVID exposures.   Patient well appearing. VSS, no hypoxia. She is very concerned regarding possibility COVID infection given symptoms and that she has a newborn at home. She denies other family members in the home are symptomatic.   COVID test positive in the ED. VSS, no change. She is felt stable and appropriate for discharge home. Discussed her concerns regarding the baby and precautions to be taken. She is encouraged to maintain close contact with her pediatrician, monitor for fever in the baby. COVID isolation instructions provided.   Final Clinical  Impressions(s) / ED Diagnoses   Final diagnoses:  None   1. COVID infection  ED Discharge Orders    None       Dennie Bible 01/17/19 2338    Sherwood Gambler, MD 01/19/19 2236

## 2019-01-17 NOTE — ED Notes (Signed)
Pt verbalize discharge instructions and follow up care. Alert and ambulatory. No IV.  

## 2019-01-17 NOTE — ED Triage Notes (Signed)
Patient complaining of headache, body aches, scratchy throat and runny nose that started yesterday. Patient had baby nov. 2, 2020.

## 2019-01-23 DIAGNOSIS — Z20828 Contact with and (suspected) exposure to other viral communicable diseases: Secondary | ICD-10-CM | POA: Diagnosis not present

## 2019-01-29 ENCOUNTER — Telehealth (INDEPENDENT_AMBULATORY_CARE_PROVIDER_SITE_OTHER): Payer: BC Managed Care – PPO | Admitting: Obstetrics and Gynecology

## 2019-01-29 ENCOUNTER — Encounter: Payer: Self-pay | Admitting: Medical

## 2019-01-29 ENCOUNTER — Encounter: Payer: Self-pay | Admitting: Obstetrics and Gynecology

## 2019-01-29 ENCOUNTER — Other Ambulatory Visit: Payer: Self-pay

## 2019-01-29 DIAGNOSIS — Z3009 Encounter for other general counseling and advice on contraception: Secondary | ICD-10-CM

## 2019-01-29 DIAGNOSIS — U071 COVID-19: Secondary | ICD-10-CM

## 2019-01-29 DIAGNOSIS — Z1389 Encounter for screening for other disorder: Secondary | ICD-10-CM

## 2019-01-29 DIAGNOSIS — O99345 Other mental disorders complicating the puerperium: Secondary | ICD-10-CM

## 2019-01-29 DIAGNOSIS — F53 Postpartum depression: Secondary | ICD-10-CM

## 2019-01-29 NOTE — Progress Notes (Signed)
TELEHEALTH POSTPARTUM VIRTUAL VIDEO VISIT ENCOUNTER NOTE  Provider location: [facility] [employer] [place]   I connected with Quaneshia Colan on 01/29/19 at 10:00 AM EST by MyChart Video Encounter at home and verified that I am speaking with the correct person using two identifiers.    I discussed the limitations, risks, security and privacy concerns of performing an evaluation and management service virtually and the availability of in person appointments. I also discussed with the patient that there may be a patient responsible charge related to this service. The patient expressed understanding and agreed to proceed.  Chief Complaint: Postpartum Visit  History of Present Illness: Shellye Zandi is a 28 y.o. African-American G2P1011 being evaluated for postpartum followup.    She is s/p normal spontaneous vaginal delivery on 12/25/18 at 38 weeks; she was discharged to home on PPD#2. Pregnancy complicated by FGR, for which she was induced. Short cervix on prometrium. Baby is doing well.  Complains of feeling very overwhelmed. She (and her entire family) were diagnosed with COVID 12 days ago, she reports she is tired but generally doing okay. Does occasionally feel short of breath. Feels she is doing better overall. Occasional chest tightness that she thinks may be due to anxiety.  Post partum bleeding stopped about a week after delivery. Had to go to hospital for an issue with stitches, and that bleeding has improved. Also having dark brown discharge that she is not sure where it is coming from.  Vaginal bleeding or discharge: Yes  Intercourse: No  Contraception: Nexplanon in place Mode of feeding infant: Breast PP depression s/s: No .  Any bowel or bladder issues: No  Pap smear: no abnormalities (date: 01/2018)  Review of Systems: Positive for n/a. Her 12 point review of systems is negative or as noted in the History of Present Illness.  Patient Active Problem List   Diagnosis Date Noted  . Gestational hypertension 12/27/2018  . IUGR (intrauterine growth restriction) affecting care of mother 12/19/2018  . Short cervix affecting pregnancy 09/26/2018  . Supervision of normal first pregnancy 06/12/2018  . Cigarette nicotine dependence without complication 04/16/2018  . Other insomnia 04/13/2018    Medications Dariela Urbanek had no medications administered during this visit. Current Outpatient Medications  Medication Sig Dispense Refill  . pantoprazole (PROTONIX) 40 MG tablet Take 40 mg by mouth daily as needed for indigestion.    . Prenatal Vit-Fe Fumarate-FA (PRENATAL VITAMINS PO) Take 1 tablet by mouth daily.     Marland Kitchen acetaminophen (TYLENOL) 325 MG tablet Take 650 mg by mouth every 6 (six) hours as needed.    Marland Kitchen amLODipine (NORVASC) 10 MG tablet Take 1 tablet (10 mg total) by mouth daily. (Patient not taking: Reported on 01/17/2019) 30 tablet 1  . ibuprofen (ADVIL) 600 MG tablet Take 1 tablet (600 mg total) by mouth every 6 (six) hours. (Patient not taking: Reported on 01/29/2019) 30 tablet 0   No current facility-administered medications for this visit.     Allergies Patient has no known allergies.  Physical Exam:  BP 113/73   Pulse (!) 108   General:  Alert, oriented and cooperative. Patient is in no acute distress.  Mental Status: Normal mood and affect. Normal behavior. Normal judgment and thought content.   Respiratory: Normal respiratory effort noted, no problems with respiration noted  Rest of physical exam deferred due to type of encounter  PP Depression Screening:   Edinburgh Postnatal Depression Scale Screening Tool 01/29/2019 12/25/2018  I have been  able to laugh and see the funny side of things. 0 0  I have looked forward with enjoyment to things. 0 0  I have blamed myself unnecessarily when things went wrong. 0 1  I have been anxious or worried for no good reason. 1 2  I have felt scared or panicky for no good reason. 0 1  Things have  been getting on top of me. 0 0  I have been so unhappy that I have had difficulty sleeping. 1 0  I have felt sad or miserable. 0 0  I have been so unhappy that I have been crying. 0 0  The thought of harming myself has occurred to me. 0 0  Edinburgh Postnatal Depression Scale Total 2 4     Assessment:Patient is a 28 y.o. G2P1011 who is 4 weeks postpartum from a normal spontaneous vaginal delivery.  She is doing okay, very overwhelmed and stressed from COVID positive testing but generally improved physically. Mentally, feels she is coping well but worried about developing depression.   Plan:  1. Postpartum state - States she is having some friends (who are clinicians/social workers like patient) watch her for PPD - Overall, she is very overwhelmed and stressed out due to [place] and being a new parent, but generally seems to be improving - reviewed to present to hospital with chest tightness that does not improve  2. Encounter for counseling regarding contraception Provided reassurance regarding discharge/bleeding  3. COVID-19 Doing well, symptoms are improved  4. Post partum depression Feels she is doing well but wants to make sure she does not become severely depressed - Ambulatory referral to [facility] Counselor   RTC prn  I discussed the assessment and treatment plan with the patient. The patient was provided an opportunity to ask questions and all were answered. The patient agreed with the plan and demonstrated an understanding of the instructions.   The patient was advised to call back or seek an in-person evaluation/go to the ED for any concerning postpartum symptoms.  I provided 20 minutes of face-to-face time during this encounter.   Sloan Leiter, MD [facility] [place], [place]

## 2019-01-30 ENCOUNTER — Other Ambulatory Visit: Payer: Self-pay

## 2019-01-30 DIAGNOSIS — Z20822 Contact with and (suspected) exposure to covid-19: Secondary | ICD-10-CM

## 2019-02-02 LAB — NOVEL CORONAVIRUS, NAA: SARS-CoV-2, NAA: NOT DETECTED

## 2019-02-07 DIAGNOSIS — Z20828 Contact with and (suspected) exposure to other viral communicable diseases: Secondary | ICD-10-CM | POA: Diagnosis not present

## 2019-02-07 NOTE — BH Specialist Note (Signed)
Integrated Behavioral Health via Telemedicine Video Visit  02/07/2019 Stephanie Woods [id number]  Number of [place] visits: 1 Session Start time: 10:16  Session End time: 11:18 Total time: 100  Referring Provider: Vivien Rota, MD Type of Visit: Video Patient/Family location: Home [facility] Provider location: [facility] All persons participating in visit: Patient Stephanie Woods and Stephanie Woods    Confirmed patient's address: Yes  Confirmed patient's phone number: Yes  Any changes to demographics: No   Confirmed patient's insurance: Yes  Any changes to patient's insurance: No   Discussed confidentiality: Yes   I connected with Stephanie Woods by a video enabled telemedicine application and verified that I am speaking with the correct person using two identifiers.     I discussed the limitations of evaluation and management by telemedicine and the availability of in person appointments.  I discussed that the purpose of this visit is to provide behavioral health care while limiting exposure to the novel coronavirus.   Discussed there is a possibility of technology failure and discussed alternative modes of communication if that failure occurs.  I discussed that engaging in this video visit, they consent to the provision of behavioral healthcare and the services will be billed under their insurance.  Patient and/or legal guardian expressed understanding and consented to video visit: Yes   PRESENTING CONCERNS: Patient and/or family reports the following symptoms/concerns: Pt states her primary concern is an increase in stress and anxiety postpartum, attributed to adjusting to new motherhood in the midst of pt, baby, and extended family experiencing positive covid. Pt open to learning very brief self-coping strategy to implement today, along with medication.  Duration of problem: Increase postpartum; Severity of problem: moderately severe  STRENGTHS (Protective  Factors/Coping Skills): Strong social support, good self-coping strategies, self-awareness  GOALS ADDRESSED: Patient will: 1.  Reduce symptoms of: anxiety, depression and stress  2.  Increase knowledge and/or ability of: coping skills and healthy habits  3.  Demonstrate ability to: Increase healthy adjustment to current life circumstances  INTERVENTIONS: Interventions utilized:  Mindfulness or Psychologist, educational and Psychoeducation and/or Health Education Standardized Assessments completed: GAD-7 and PHQ 9  ASSESSMENT: Patient currently experiencing Adjustment disorder with mixed anxiety and depressed mood.   Patient may benefit from psychoeducation and brief therapeutic interventions regarding coping with symptoms of anxiety and depression .  PLAN: 1. Follow up with behavioral health clinician on : One week 2. Behavioral recommendations:  -Begin taking BH medication as prescribed -CALM relaxation breathing exercise twice daily (morning; at bedtime) -Begin taking 20 minute nap after morning iced coffee daily -Continue using sleep sounds at night; consider additional apps as needed -Consider new mom support groups: Mom Talk (conehealthybaby.com) or groups at postpartum.net, as additional support 3. Referral(s): [place] (In Clinic)  I discussed the assessment and treatment plan with the patient and/or parent/guardian. They were provided an opportunity to ask questions and all were answered. They agreed with the plan and demonstrated an understanding of the instructions.   They were advised to call back or seek an in-person evaluation if the symptoms worsen or if the condition fails to improve as anticipated.  Caroleen Hamman Donold Marotto  Depression screen [facility] 2/9 02/12/2019 04/13/2018  Decreased Interest 3 0  Down, Depressed, Hopeless 0 0  PHQ - 2 Score 3 0  Altered sleeping 3 -  Tired, decreased energy 3 -  Change in appetite 3 -  Feeling bad or failure  about yourself  0 -  Trouble concentrating 0 -  Moving slowly or fidgety/restless 3 -  Suicidal thoughts 0 -  PHQ-9 Score 15 -   GAD 7 : Generalized Anxiety Score 02/12/2019  Nervous, Anxious, on Edge 3  Control/stop worrying 3  Worry too much - different things 3  Trouble relaxing 3  Restless 1  Easily annoyed or irritable 2  Afraid - awful might happen 3  Total GAD 7 Score 18

## 2019-02-09 ENCOUNTER — Other Ambulatory Visit: Payer: Self-pay | Admitting: Obstetrics and Gynecology

## 2019-02-12 ENCOUNTER — Ambulatory Visit (INDEPENDENT_AMBULATORY_CARE_PROVIDER_SITE_OTHER): Payer: BC Managed Care – PPO | Admitting: Clinical

## 2019-02-12 DIAGNOSIS — O99345 Other mental disorders complicating the puerperium: Secondary | ICD-10-CM | POA: Diagnosis not present

## 2019-02-12 DIAGNOSIS — F53 Postpartum depression: Secondary | ICD-10-CM | POA: Diagnosis not present

## 2019-02-12 DIAGNOSIS — F4323 Adjustment disorder with mixed anxiety and depressed mood: Secondary | ICD-10-CM

## 2019-02-12 NOTE — BH Specialist Note (Signed)
Integrated Behavioral Health via Telemedicine Video Visit  02/12/2019 Stephanie Woods [id number]  Number of [place] visits: 2 Session Start time: 9:04  Session End time: 9:20 Total time: 16  Referring Provider: Vivien Rota, MD Type of Visit: Video Patient/Family location: Home [facility] Provider location: [facility] All persons participating in visit: Patient Stephanie Woods and Stephanie Woods    Confirmed patient's address: Yes  Confirmed patient's phone number: Yes  Any changes to demographics: No   Confirmed patient's insurance: Yes  Any changes to patient's insurance: No   Discussed confidentiality: At previous visit  I connected with Stephanie Woods  by a video enabled telemedicine application and verified that I am speaking with the correct person using two identifiers.     I discussed the limitations of evaluation and management by telemedicine and the availability of in person appointments.  I discussed that the purpose of this visit is to provide behavioral health care while limiting exposure to the novel coronavirus.   Discussed there is a possibility of technology failure and discussed alternative modes of communication if that failure occurs.  I discussed that engaging in this video visit, they consent to the provision of behavioral healthcare and the services will be billed under their insurance.  Patient and/or legal guardian expressed understanding and consented to video visit: Yes   PRESENTING CONCERNS: Patient and/or family reports the following symptoms/concerns: Pt's primary symptom is fatigue;  is no longer feeling "chest tightness" with anxiety after starting Zoloft. Pt's sleep has improved, and feels she is adjusting well with the support of friends and family.  Duration of problem: Increase postpartum; Severity of problem: mild  STRENGTHS (Protective Factors/Coping Skills): Strong social support, good self-coping skills,  self-awareness  GOALS ADDRESSED: Patient will: 1.  Reduce symptoms of: anxiety, depression and stress  2.  Demonstrate ability to: Increase healthy adjustment to current life circumstances  INTERVENTIONS: Interventions utilized:  Supportive Counseling and Medication Monitoring Standardized Assessments completed: GAD-7 and PHQ 9  ASSESSMENT: Patient currently experiencing Adjustment disorder with mixed anxiety and depressed mood.   Patient may benefit from continued psychoeducation and brief therapeutic interventions regarding coping with symptoms of depression and anxiety .  PLAN: 1. Follow up with behavioral health clinician on : As needed 2. Behavioral recommendations:  -Continue taking Zoloft as prescribed; talk to PCP about continuing to manage -Continue using self-coping strategies daily as needed (relaxation breathing, naps as needed, sleep sounds at bedtime; resume journal and reading when ready) -Continue to consider online new mom support groups, as needed (conehealthybaby.com or postpartum.net) 3. Referral(s): [place] (In Clinic)  I discussed the assessment and treatment plan with the patient and/or parent/guardian. They were provided an opportunity to ask questions and all were answered. They agreed with the plan and demonstrated an understanding of the instructions.   They were advised to call back or seek an in-person evaluation if the symptoms worsen or if the condition fails to improve as anticipated.  Stephanie Woods Stephanie Woods  Depression screen [facility] 2/9 02/20/2019 02/12/2019 04/13/2018  Decreased Interest 0 3 0  Down, Depressed, Hopeless 0 0 0  PHQ - 2 Score 0 3 0  Altered sleeping 0 3 -  Tired, decreased energy 3 3 -  Change in appetite 1 3 -  Feeling bad or failure about yourself  0 0 -  Trouble concentrating 0 0 -  Moving slowly or fidgety/restless 0 3 -  Suicidal thoughts 0 0 -  PHQ-9 Score 4 15 -  GAD 7 : Generalized Anxiety Score  02/20/2019 02/12/2019  Nervous, Anxious, on Edge 1 3  Control/stop worrying 0 3  Worry too much - different things 0 3  Trouble relaxing 0 3  Restless 0 1  Easily annoyed or irritable 0 2  Afraid - awful might happen 0 3  Total GAD 7 Score 1 18

## 2019-02-14 ENCOUNTER — Telehealth: Payer: Self-pay | Admitting: Obstetrics and Gynecology

## 2019-02-14 NOTE — Progress Notes (Signed)
Patient called after hours line and got zoloft sent in. Contacted this am and she is doing well. To call with any issues.  -KMD

## 2019-02-14 NOTE — Telephone Encounter (Signed)
Called patient to follow up regarding message from [facility] and desiring to start medication for anxiety. No answer, unable to leave VM.   Feliz Beam, M.D. Attending Center for [employer])

## 2019-02-19 ENCOUNTER — Encounter: Payer: Self-pay | Admitting: *Deleted

## 2019-02-20 ENCOUNTER — Encounter: Payer: Self-pay | Admitting: *Deleted

## 2019-02-20 ENCOUNTER — Other Ambulatory Visit: Payer: Self-pay

## 2019-02-20 ENCOUNTER — Ambulatory Visit (INDEPENDENT_AMBULATORY_CARE_PROVIDER_SITE_OTHER): Payer: BC Managed Care – PPO | Admitting: Clinical

## 2019-02-20 DIAGNOSIS — F4323 Adjustment disorder with mixed anxiety and depressed mood: Secondary | ICD-10-CM | POA: Diagnosis not present

## 2019-03-07 ENCOUNTER — Other Ambulatory Visit: Payer: Self-pay | Admitting: General Practice

## 2019-03-07 MED ORDER — SERTRALINE HCL 50 MG PO TABS: 50.0000 mg | ORAL_TABLET | Freq: Every day | ORAL | 2 refills | Status: DC

## 2019-03-12 ENCOUNTER — Other Ambulatory Visit: Payer: Self-pay | Admitting: Obstetrics and Gynecology

## 2019-03-13 ENCOUNTER — Encounter: Payer: Self-pay | Admitting: Obstetrics and Gynecology

## 2019-03-13 ENCOUNTER — Other Ambulatory Visit: Payer: Self-pay

## 2019-03-13 ENCOUNTER — Ambulatory Visit (INDEPENDENT_AMBULATORY_CARE_PROVIDER_SITE_OTHER): Payer: BC Managed Care – PPO | Admitting: Obstetrics and Gynecology

## 2019-03-13 VITALS — BP 103/70 | HR 89 | Temp 98.4°F | Wt 180.0 lb

## 2019-03-13 DIAGNOSIS — O9279 Other disorders of lactation: Secondary | ICD-10-CM | POA: Diagnosis not present

## 2019-03-13 DIAGNOSIS — O9229 Other disorders of breast associated with pregnancy and the puerperium: Secondary | ICD-10-CM

## 2019-03-13 DIAGNOSIS — N644 Mastodynia: Secondary | ICD-10-CM | POA: Diagnosis not present

## 2019-03-13 NOTE — Progress Notes (Signed)
29 yo P1 presenting today for evaluation of left breast pain. Patient reports onset of severe left breast pain and engorgement on Sunday. She has been taking warm showers and continue to express milk with some significant improvement in her pain. She reports noticing blood in her milk on Sunday but that also has significantly improved. Patient plans to continue to breastfeed but admits to supplementing with formula until resolution of these symptoms. Patient denies any fevers  Past Medical History:  Diagnosis Date  . Asthma    Past Surgical History:  Procedure Laterality Date  . TONSILLECTOMY     Family History  Problem Relation Age of Onset  . Hypertension Maternal Grandmother   . Hypertension Mother   . Healthy Father    Social History   Tobacco Use  . Smoking status: Former Smoker    Packs/day: 0.25    Years: 9.00    Pack years: 2.25  . Smokeless tobacco: Never Used  Substance Use Topics  . Alcohol use: Not Currently    Comment: social  . Drug use: No   ROS See pertinent in HPI. Other systems reviewed and negative  Blood pressure 103/70, pulse 89, temperature 98.4 F (36.9 C), temperature source Oral, weight 180 lb (81.6 kg), unknown if currently breastfeeding. GENERAL: Well-developed, well-nourished female in no acute distress.  BREASTS: Symmetric in size. No palpable masses or lymphadenopathy, skin changes, or nipple drainage. Palpable dilated ducts slightly tender to touch on the sternal upper quadrant of the left breast. No breast engorgement. No palpable fluctuance EXTREMITIES: No cyanosis, clubbing, or edema, 2+ distal pulses.  A/P 29 yo with left breast pain - Encouraged patient to continue breastfeeding and to apply warm compresses to the area - Patient to return if symptoms do not resolve - Plan for breast ultrasound if persistent or non resolving symptoms

## 2019-03-13 NOTE — Progress Notes (Signed)
CC:  Left Breast pain on Sunday.  Pt notes taking a warm bath and expressing breast since pain has eased up  Pt notes blood in breast Milk since Sunday.  Pain was 11/10x per pt none today just tenderness

## 2019-03-16 ENCOUNTER — Other Ambulatory Visit: Payer: Self-pay | Admitting: Obstetrics and Gynecology

## 2019-03-16 MED ORDER — PRENATAL VITAMINS 28-0.8 MG PO TABS: 1.0000 | ORAL_TABLET | Freq: Every day | ORAL | 5 refills | Status: AC

## 2019-03-20 ENCOUNTER — Telehealth: Payer: Self-pay | Admitting: Obstetrics and Gynecology

## 2019-03-20 MED ORDER — VITAFOL GUMMIES 3.33-0.333-34.8 MG PO CHEW: 1.0000 | CHEWABLE_TABLET | Freq: Every day | ORAL | 3 refills | Status: AC

## 2019-03-20 NOTE — Telephone Encounter (Signed)
Patient called stating the vitamins refilled last week are making her sick.  She is requesting brand name Vitafol gummies.   Routed to pharmacy.

## 2019-04-23 ENCOUNTER — Encounter: Payer: Self-pay | Admitting: *Deleted

## 2020-03-31 ENCOUNTER — Encounter (HOSPITAL_COMMUNITY): Payer: Self-pay | Admitting: *Deleted

## 2020-03-31 ENCOUNTER — Ambulatory Visit (INDEPENDENT_AMBULATORY_CARE_PROVIDER_SITE_OTHER): Payer: BC Managed Care – PPO

## 2020-03-31 ENCOUNTER — Ambulatory Visit (HOSPITAL_COMMUNITY): Payer: BC Managed Care – PPO

## 2020-03-31 ENCOUNTER — Ambulatory Visit (HOSPITAL_COMMUNITY)
Admission: EM | Admit: 2020-03-31 | Discharge: 2020-03-31 | Disposition: A | Discharge status: Home / Self Care | Payer: BC Managed Care – PPO | Attending: Student | Admitting: Student

## 2020-03-31 ENCOUNTER — Other Ambulatory Visit: Payer: Self-pay

## 2020-03-31 DIAGNOSIS — W009XXA Unspecified fall due to ice and snow, initial encounter: Secondary | ICD-10-CM

## 2020-03-31 DIAGNOSIS — S39012A Strain of muscle, fascia and tendon of lower back, initial encounter: Secondary | ICD-10-CM

## 2020-03-31 DIAGNOSIS — W19XXXA Unspecified fall, initial encounter: Secondary | ICD-10-CM

## 2020-03-31 DIAGNOSIS — M533 Sacrococcygeal disorders, not elsewhere classified: Secondary | ICD-10-CM | POA: Diagnosis not present

## 2020-03-31 MED ORDER — IBUPROFEN 800 MG PO TABS: 800.0000 mg | ORAL_TABLET | Freq: Three times a day (TID) | ORAL | 0 refills | Status: DC

## 2020-03-31 MED ORDER — TIZANIDINE HCL 2 MG PO CAPS: 2.0000 mg | ORAL_CAPSULE | Freq: Three times a day (TID) | ORAL | 0 refills | Status: DC

## 2020-03-31 NOTE — ED Provider Notes (Signed)
[facility]    CSN: [id number] Arrival date & time: 03/31/20  7846      History   Chief Complaint Chief Complaint  Patient presents with  . Fall    HPI Stephanie Woods is a 30 y.o. female presenting following falling on ice 2 hours ago.  History noncontributory.  She states that she was walking down brick steps this morning and slipped on ice, landing on her left side.  Since then she endorses pain right over her left buttock, pain also elicited over left buttock with walking and pain with movement of the left leg.  Denies injury or pain to any other limb, denies head trauma, denies headaches, denies weakness/sensation changes in UEs/LEs, denies dizziness before or accident.   HPI  Past Medical History:  Diagnosis Date  . Asthma     Patient Active Problem List   Diagnosis Date Noted  . COVID-19 01/29/2019  . Gestational hypertension 12/27/2018  . IUGR (intrauterine growth restriction) affecting care of mother 12/19/2018  . Short cervix affecting pregnancy 09/26/2018  . Supervision of normal first pregnancy 06/12/2018  . Cigarette nicotine dependence without complication 04/16/2018  . Other insomnia 04/13/2018    Past Surgical History:  Procedure Laterality Date  . TONSILLECTOMY      OB History    Gravida  2   Para  1   Term  1   Preterm      AB  1   Living  1     SAB      IAB  1   Ectopic      Multiple  0   Live Births  1            Home Medications    Prior to Admission medications   Medication Sig Start Date End Date Taking? Authorizing Provider  ibuprofen (ADVIL) 800 MG tablet Take 1 tablet (800 mg total) by mouth 3 (three) times daily. 03/31/20  Yes Rhys Martini, PA-C  tizanidine (ZANAFLEX) 2 MG capsule Take 1 capsule (2 mg total) by mouth 3 (three) times daily. 03/31/20  Yes Rhys Martini, PA-C  acetaminophen (TYLENOL) 325 MG tablet Take 650 mg by mouth every 6 (six) hours as needed.    [provider]   pantoprazole (PROTONIX) 40 MG tablet Take 40 mg by mouth daily as needed for indigestion. 01/02/19   [provider]  Prenatal Vit-Fe Phos-FA-Omega (VITAFOL GUMMIES) 3.33-0.333-34.8 MG CHEW Chew 1 each by mouth daily. 03/20/19   Constant, Peggy, MD  sertraline (ZOLOFT) 50 MG tablet Take 1 tablet (50 mg total) by mouth daily. 03/07/19   Conan Bowens, MD  amLODipine (NORVASC) 10 MG tablet Take 1 tablet (10 mg total) by mouth daily. Patient not taking: Reported on 01/17/2019 12/28/18 03/31/20  Raelyn Mora, CNM    Family History Family History  Problem Relation Age of Onset  . Hypertension Maternal Grandmother   . Hypertension Mother   . Healthy Father     Social History Social History   Tobacco Use  . Smoking status: Former Smoker    Packs/day: 0.25    Years: 9.00    Pack years: 2.25  . Smokeless tobacco: Never Used  Vaping Use  . Vaping Use: Never used  Substance Use Topics  . Alcohol use: Not Currently    Comment: social  . Drug use: No     Allergies   Patient has no known allergies.   Review of Systems Review of Systems  Musculoskeletal:  Pain over left buttock  All other systems reviewed and are negative.    Physical Exam Triage Vital Signs ED Triage Vitals  Enc Vitals Group     BP 03/31/20 0946 110/73     Pulse Rate 03/31/20 0946 79     Resp 03/31/20 0946 16     Temp 03/31/20 0946 98.4 F (36.9 C)     Temp Source 03/31/20 0946 Oral     SpO2 03/31/20 0946 100 %     Weight --      Height --      Head Circumference --      Peak Flow --      Pain Score 03/31/20 0948 9     Pain Loc --      Pain Edu? --      Excl. in GC? --    No data found.  Updated Vital Signs BP 110/73 (BP Location: Left Arm)   Pulse 79   Temp 98.4 F (36.9 C) (Oral)   Resp 16   LMP 02/25/2020   SpO2 100%   Breastfeeding No Comment: Nexplanon  Visual Acuity Right Eye Distance:   Left Eye Distance:   Bilateral Distance:    Right Eye Near:   Left Eye  Near:    Bilateral Near:     Physical Exam Vitals reviewed.  Constitutional:      General: She is not in acute distress.    Appearance: Normal appearance. She is not ill-appearing.  HENT:     Head: Normocephalic and atraumatic. No abrasion, contusion or laceration.     Nose: No signs of injury.  Eyes:     Extraocular Movements: Extraocular movements intact.     Pupils: Pupils are equal, round, and reactive to light.  Cardiovascular:     Rate and Rhythm: Normal rate and regular rhythm.     Heart sounds: Normal heart sounds.  Pulmonary:     Effort: Pulmonary effort is normal.     Breath sounds: Normal breath sounds and air entry. No wheezing, rhonchi or rales.  Abdominal:     Palpations: Abdomen is soft.     Tenderness: There is no abdominal tenderness. There is no guarding or rebound.  Musculoskeletal:     Cervical back: Normal, full passive range of motion without pain, normal range of motion and neck supple. No swelling, deformity, signs of trauma, rigidity, spasms, tenderness, bony tenderness or crepitus. No pain with movement.     Thoracic back: Normal. No swelling, edema, deformity, signs of trauma, lacerations, spasms, tenderness or bony tenderness. Normal range of motion. No scoliosis.     Lumbar back: Normal. No swelling, edema, deformity, signs of trauma, lacerations, spasms, tenderness or bony tenderness. Normal range of motion. Negative right straight leg raise test and negative left straight leg raise test. No scoliosis.       Back:     Comments: Full ROM in UEs and LEs. Strength grossly 5/5 in UEs and LEs. No boney deformity or tenderness palpated in shoulders/elbows/wrists/hips/knees/ankles/spine. Gait intact but with pain. Tender to palpation just proximal to L buttock. No ecchymosis, bony deformity. Pain elicited with flexion L hip.   Lymphadenopathy:     Cervical: No cervical adenopathy.  Skin:    Findings: No bruising.  Neurological:     General: No focal deficit  present.     Mental Status: She is alert and oriented to person, place, and time. Mental status is at baseline.     Cranial Nerves: Cranial nerves  are intact. No cranial nerve deficit or facial asymmetry.     Sensory: Sensation is intact. No sensory deficit.     Motor: Motor function is intact. No weakness.     Coordination: Coordination is intact. Coordination normal.     Gait: Gait is intact. Gait normal.     Comments: CN 2-12 grossly intact  Psychiatric:        Attention and Perception: Attention and perception normal.        Mood and Affect: Mood and affect normal.        Behavior: Behavior normal. Behavior is cooperative.        Thought Content: Thought content normal.        Judgment: Judgment normal.      UC Treatments / Results  Labs (all labs ordered are listed, but only abnormal results are displayed) Labs Reviewed - No data to display  EKG   Radiology DG Sacrum/Coccyx  Result Date: 03/31/2020 CLINICAL DATA:  Fall with buttock pain. EXAM: SACRUM AND COCCYX - 2+ VIEW COMPARISON:  None. FINDINGS: No acute fracture identified. Sacroiliac joints have a symmetric and normal appearance bilaterally. 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 No bony lesions or destruction. IMPRESSION: No acute findings. Electronically Signed   By: Irish Lack M.D.   On: 03/31/2020 10:49    Procedures Procedures (including critical care time)  Medications Ordered in UC Medications - No data to display  Initial Impression / Assessment and Plan / UC Course  I have reviewed the triage vital signs and the nursing notes.  Pertinent labs & imaging results that were available during my care of the patient were reviewed by me and considered in my medical decision making (see chart for details).     Patient presenting with L distal lumbar paraspinous muscle tenderness following slipping on ice today. No other injury, no LOC, no dizziness or headaches before or after fall.  Xray  sacrum/coccyx with no acute bony abnormality. Reassurance provided.   Spent over 40 minutes obtaining H&P, performing physical, interpreting films, discussing results, treatment plan and plan for follow-up with patient. Patient agrees with plan.   This chart was dictated using voice recognition software, Dragon. Despite the best efforts of this provider to proofread and correct errors, errors may still occur which can change documentation meaning.   Final Clinical Impressions(s) / UC Diagnoses   Final diagnoses:  Fall due to slipping on ice or snow, initial encounter     Discharge Instructions     -Take the muscle relaxer- zanaflex as needed, up to 3x daily for lower back pain. This medication can make you drowsy so take at night or when you don't need to drive/operate machinery. Avoid taking with alcohol. -Also try ibuprofen 800mg  up to 3x daily for pain. I sent a prescription for this.  -come back and see if you pain gets worse instead of better; if you develop new pain; if you develop new dizziness/headaches/ etc.     ED Prescriptions    Medication Sig Dispense Auth. Provider   tizanidine (ZANAFLEX) 2 MG capsule Take 1 capsule (2 mg total) by mouth 3 (three) times daily. 21 capsule [place], PA-C   ibuprofen (ADVIL) 800 MG tablet Take 1 tablet (800 mg total) by mouth 3 (three) times daily. 21 tablet Rhys Martini, PA-C     PDMP not reviewed this encounter.   Rhys Martini, PA-C 03/31/20 1103

## 2020-03-31 NOTE — ED Triage Notes (Signed)
Pt reports she fell on ice this AM on Brick steps. Pt was her Daughter in her arms and turned to protect Daughter . Pt reports she now has Lower LT back pain. Pt also reports just after there was tingling in Lt leg . The tingling has resolved now

## 2020-03-31 NOTE — Discharge Instructions (Signed)
-  Take the muscle relaxer- zanaflex as needed, up to 3x daily for lower back pain. This medication can make you drowsy so take at night or when you don't need to drive/operate machinery. Avoid taking with alcohol. -Also try ibuprofen 800mg  up to 3x daily for pain. I sent a prescription for this.  -come back and see if you pain gets worse instead of better; if you develop new pain; if you develop new dizziness/headaches/ etc.

## 2020-06-20 DIAGNOSIS — F419 Anxiety disorder, unspecified: Secondary | ICD-10-CM | POA: Insufficient documentation

## 2020-08-05 NOTE — Progress Notes (Signed)
Presents to [facility] for on-site pre-employment drug screen.   Rapid Drug Screen Results =negative

## 2020-08-06 ENCOUNTER — Other Ambulatory Visit: Payer: Self-pay

## 2020-08-06 DIAGNOSIS — Z0283 Encounter for blood-alcohol and blood-drug test: Secondary | ICD-10-CM

## 2020-09-04 ENCOUNTER — Ambulatory Visit (INDEPENDENT_AMBULATORY_CARE_PROVIDER_SITE_OTHER): Payer: BC Managed Care – PPO | Admitting: Plastic Surgery

## 2020-09-04 ENCOUNTER — Other Ambulatory Visit: Payer: Self-pay

## 2020-09-04 ENCOUNTER — Encounter: Payer: Self-pay | Admitting: Plastic Surgery

## 2020-09-04 VITALS — BP 96/67 | HR 93 | Ht 62.0 in | Wt 189.0 lb

## 2020-09-04 DIAGNOSIS — M545 Low back pain, unspecified: Secondary | ICD-10-CM | POA: Diagnosis not present

## 2020-09-04 DIAGNOSIS — M546 Pain in thoracic spine: Secondary | ICD-10-CM | POA: Diagnosis not present

## 2020-09-04 DIAGNOSIS — N62 Hypertrophy of breast: Secondary | ICD-10-CM | POA: Diagnosis not present

## 2020-09-04 DIAGNOSIS — M4004 Postural kyphosis, thoracic region: Secondary | ICD-10-CM | POA: Diagnosis not present

## 2020-09-04 NOTE — Progress Notes (Signed)
Referring Provider Dorothyann Peng, MD [address] [place],  [place] [address]   CC:  Chief Complaint  Patient presents with   consult      Stephanie Woods is an 30 y.o. female.  HPI: Patient presents to discuss breast reduction.  She said years of back pain, neck pain and shoulder grooving related to her large breasts.  She tried over-the-counter medications, warm packs, cold packs and supportive bras with little relief.  She is currently triple D and wants to be a C cup.  She also gets skin irritation beneath her breast that been refractory to over-the-counter treatments.  She has not had any previous breast biopsies or procedures.  She smokes socially tobacco products about once a week.  She does not have diabetes and is not on any blood thinners.  No Known Allergies  Outpatient Encounter Medications as of 09/04/2020  Medication Sig Note   ibuprofen (ADVIL) 800 MG tablet Take 1 tablet (800 mg total) by mouth 3 (three) times daily.    sertraline (ZOLOFT) 50 MG tablet Take 1 tablet (50 mg total) by mouth daily.    acetaminophen (TYLENOL) 325 MG tablet Take 650 mg by mouth every 6 (six) hours as needed.    pantoprazole (PROTONIX) 40 MG tablet Take 40 mg by mouth daily as needed for indigestion.    Prenatal Vit-Fe Phos-FA-Omega (VITAFOL GUMMIES) 3.33-0.333-34.8 MG CHEW Chew 1 each by mouth daily.    tizanidine (ZANAFLEX) 2 MG capsule Take 1 capsule (2 mg total) by mouth 3 (three) times daily.    [DISCONTINUED] amLODipine (NORVASC) 10 MG tablet Take 1 tablet (10 mg total) by mouth daily. (Patient not taking: Reported on 01/17/2019) 01/17/2019: Pt endorses her blood pressure has been within normal limits and she has not needed this med lately.    No facility-administered encounter medications on file as of 09/04/2020.     Past Medical History:  Diagnosis Date   Asthma     Past Surgical History:  Procedure Laterality Date   TONSILLECTOMY      Family History  Problem  Relation Age of Onset   Hypertension Maternal Grandmother    Hypertension Mother    Healthy Father     Social History   Social History Narrative   Not on file     Review of Systems General: Denies fevers, chills, weight loss CV: Denies chest pain, shortness of breath, palpitations  Physical Exam Vitals with BMI 09/04/2020 03/31/2020 03/13/2019  Height 5\' 2"  - -  Weight 189 lbs - 180 lbs  BMI 34.56 - -  Systolic 96 110 103  Diastolic 67 73 70  Pulse 93 79 89    General:  No acute distress,  Alert and oriented, Non-Toxic, Normal speech and affect Breast: She has grade 3 ptosis.  Sternal notch to nipple is 33 cm bilaterally.  Nipple to fold is 18 cm bilaterally.  No obvious scars or masses.  Assessment/Plan The patient has bilateral symptomatic macromastia.  She is a good candidate for a breast reduction.  She is interested in pursuing surgical treatment.  She has tried supportive garments and fitted bras with no relief.  The details of breast reduction surgery were discussed.  I explained the procedure in detail along the with the expected scars.  The risks were discussed in detail and include bleeding, infection, damage to surrounding structures, need for additional procedures, nipple loss, change in nipple sensation, persistent pain, contour irregularities and asymmetries.  I explained that breast feeding  is often not possible after breast reduction surgery.  We discussed the expected postoperative course with an overall recovery period of about 1 month.  She demonstrated full understanding of all risks.  We discussed her personal risk factors that include tobacco use.  I explained this would increase her chances of wound healing complications but she feels confident she would be able to stop this around her surgery date.  I explained she would need to stop 1 month before and stay quit for 1 month after 2 reduce her risk..  The patient is interested in pursuing surgical treatment.  She  prefers summertime to do this procedure and wants to target next summer.  We will plan to have her come back in March 2 planned for June or July surgery next summer.  If she changes her mind and wants to do it sooner then we will reevaluate.  I anticipate approximately 600g of tissue removed from each side.   Allena Napoleon 09/04/2020, 11:54 AM

## 2020-12-13 IMAGING — US US MFM UA CORD DOPPLER
1 series · 15 of 19 positions shown · non-contrast
Comparison: none

[Series 1: us mfm ua cord doppler · 19 acquisitions, 15 frames shown]
[im 1/19]
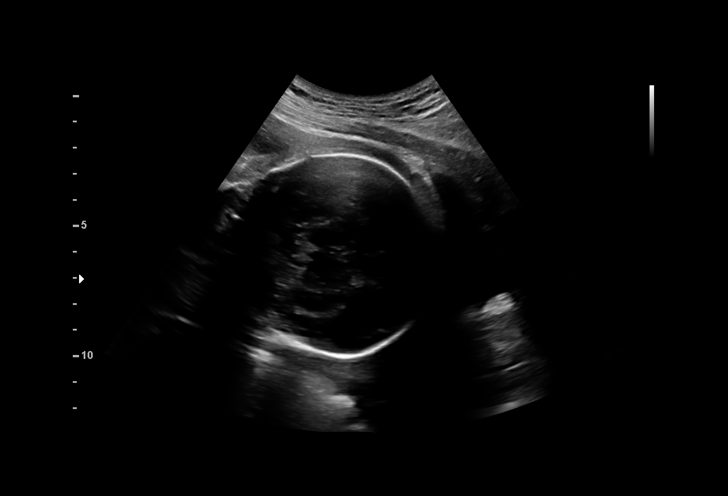
[im 2/19]
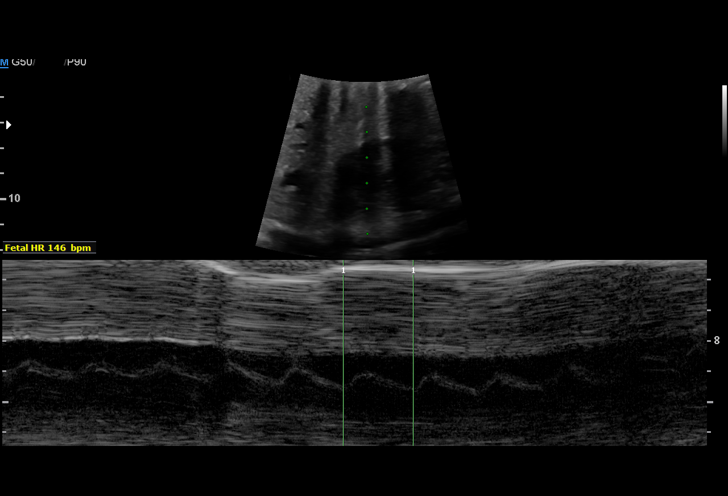
[im 4/19]
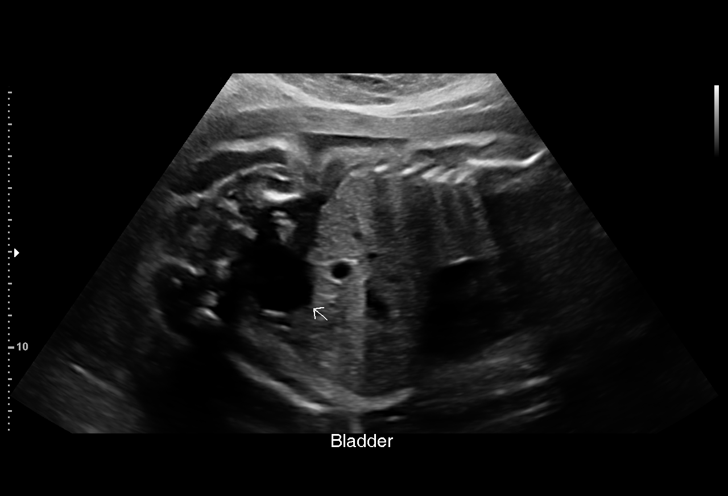
[im 5/19]
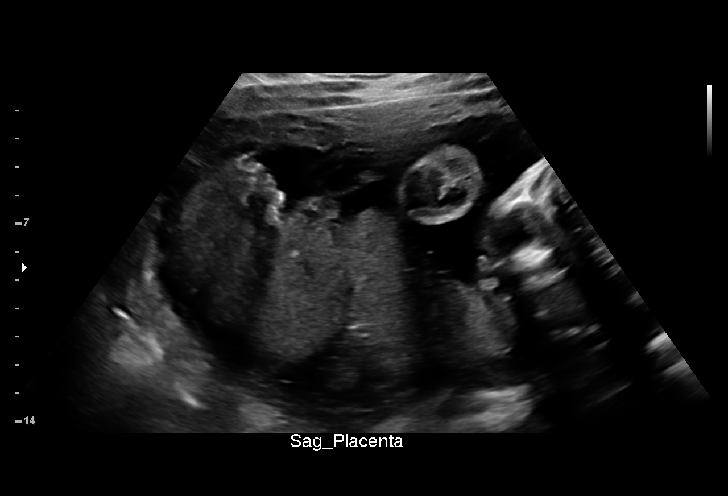
[im 6/19]
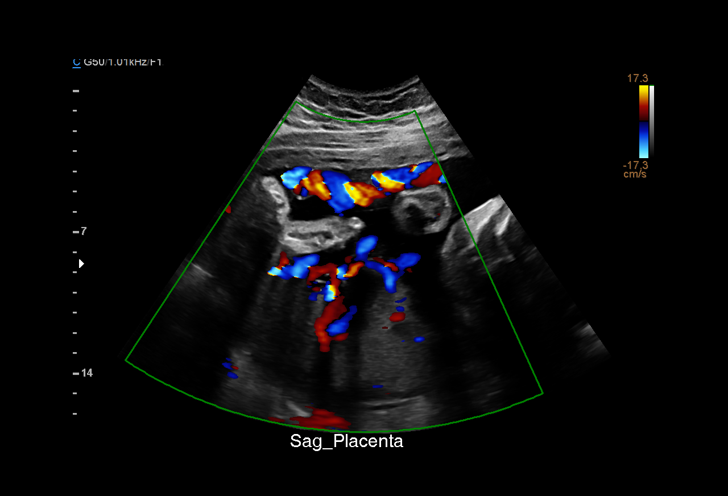
[im 7/19]
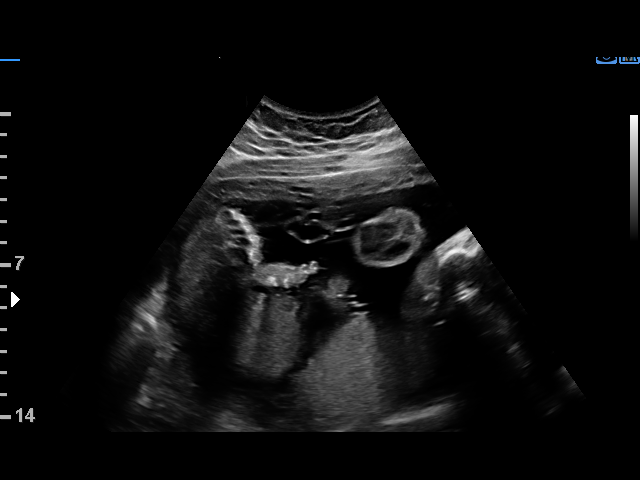
[im 9/19]
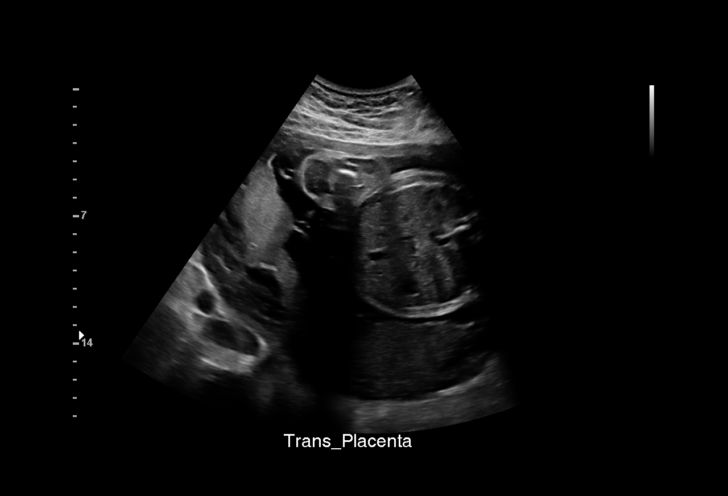
[im 10/19]
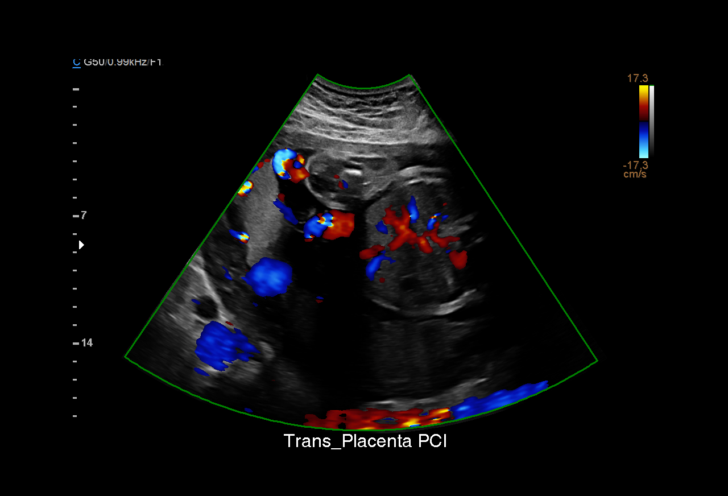
[im 11/19]
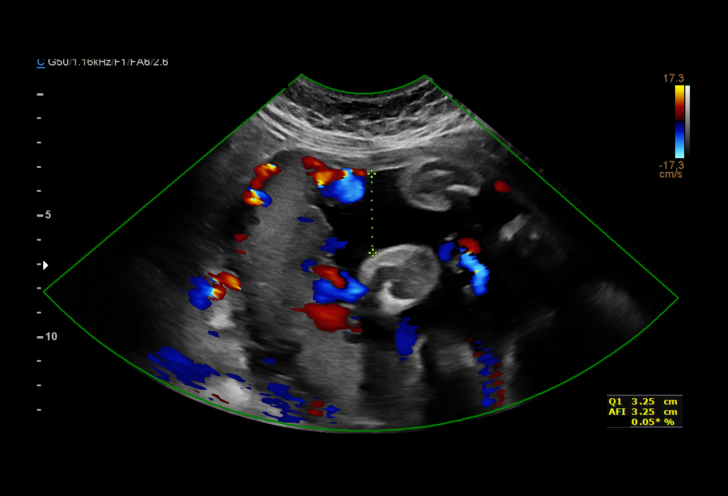
[im 13/19]
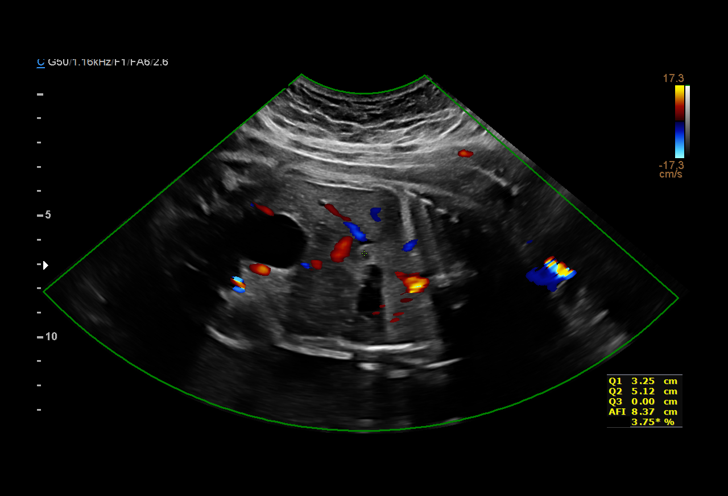
[im 14/19]
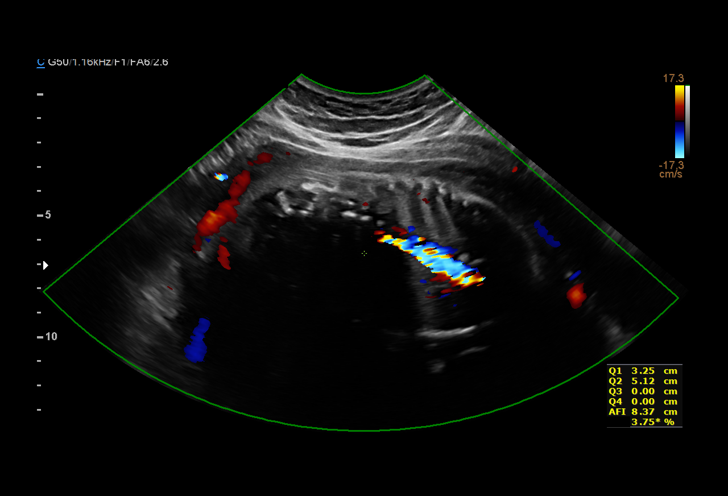
[im 15/19]
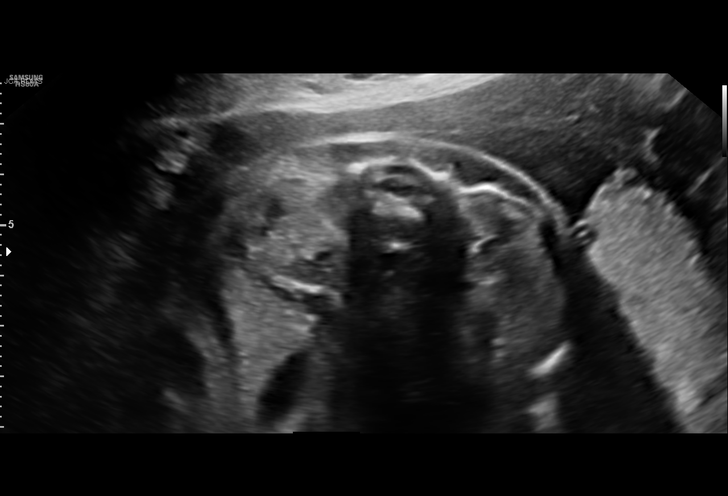
[im 16/19]
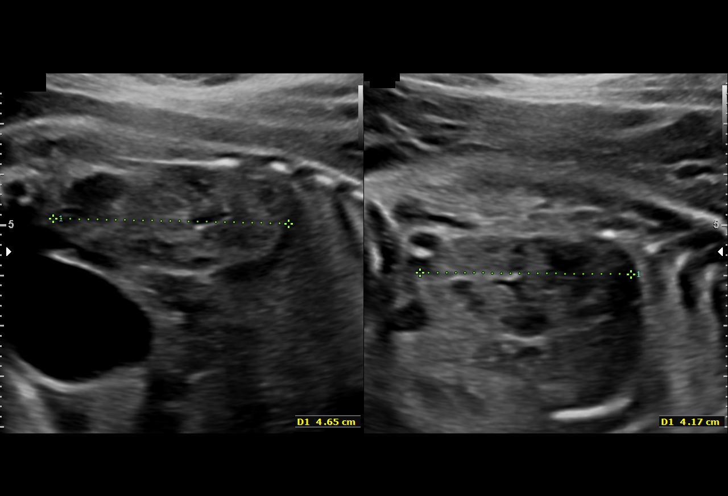
[im 18/19]
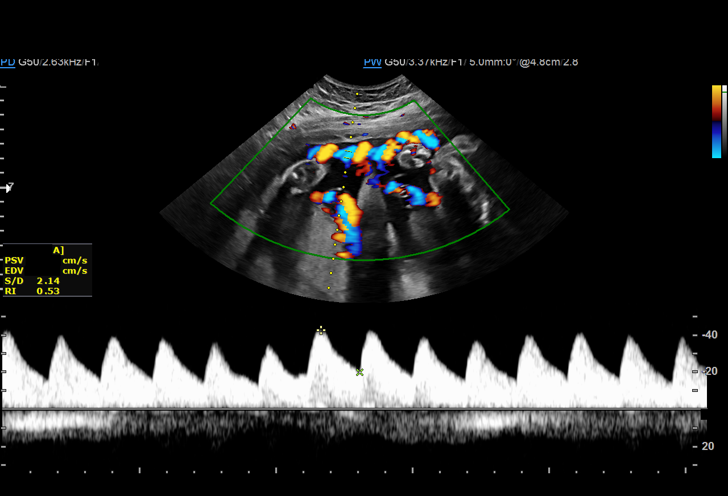
[im 19/19]
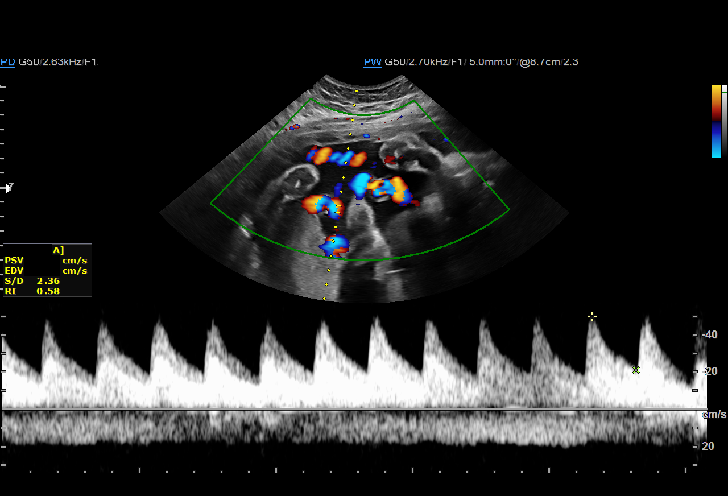

[15 of 19 positions shown; findings below may reference images not displayed]

----------------------------------------------------------------------

 ----------------------------------------------------------------------
Indications

  Cervical shortening complicating pregnancy
  Encounter for other antenatal screening
  follow-up (low risk NIPS)
  Obesity complicating pregnancy, second
  trimester (pregravid BMI 34.03)
  Maternal care for known or suspected poor
  fetal growth, third trimester, not applicable or
  unspecified
  30 weeks gestation of pregnancy
 ----------------------------------------------------------------------
Vital Signs

 BMI:
Fetal Evaluation

 Num Of Fetuses:          1
 Fetal Heart Rate(bpm):   146
 Cardiac Activity:        Observed
 Presentation:            Cephalic
 Placenta:                Posterior
 P. Cord Insertion:       Visualized

 Amniotic Fluid
 AFI FV:      Within normal limits

 AFI Sum(cm)     %Tile       Largest Pocket(cm)
 8.37            4

 RUQ(cm)       RLQ(cm)       LUQ(cm)        LLQ(cm)
 3.25          0             5.12           0
OB History

 Gravidity:    2
 TOP:          1        Living:  0
Gestational Age

 LMP:           30w 6d        Date:  04/01/18                 EDD:   01/06/19
 Best:          30w 6d     Det. By:  LMP  (04/01/18)          EDD:   01/06/19
Doppler - Fetal Vessels

 Umbilical Artery
  S/D     %tile                                            ADFV    RDFV
 2.44       29                                                No      No

Impression

 UA Dopplers normal
 Normal amniotic fluid
 Prior EFW 3%
Recommendations

 Continue weekly testing with UA dopplers
 Repeat growth in 2 weeks.

## 2021-01-03 IMAGING — US US MFM FETAL BPP W/ NON-STRESS
1 series · 15 of 20 positions shown · non-contrast
Comparison: none

[Series 1: us mfm fetal bpp w/ non-stress · 20 acquisitions, 15 frames shown]
[im 1/20]
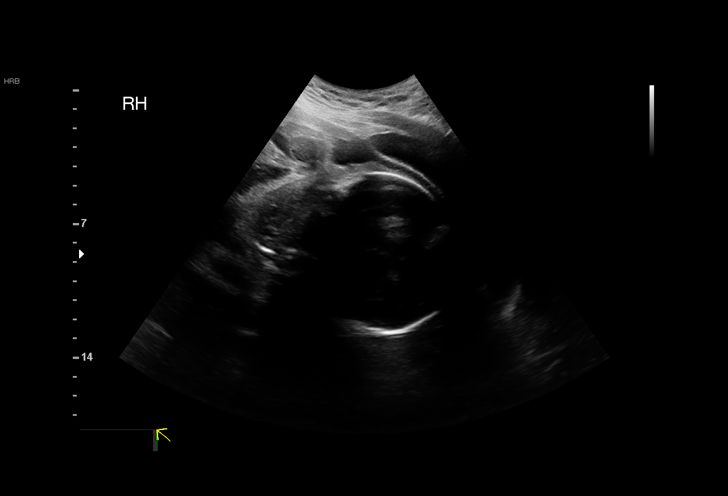
[im 3/20]
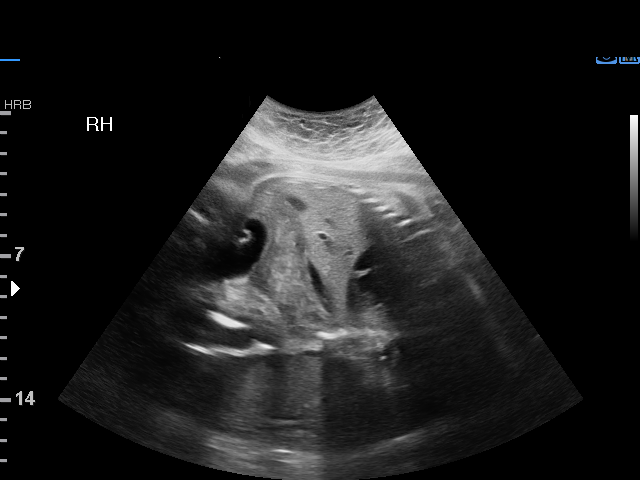
[im 4/20]
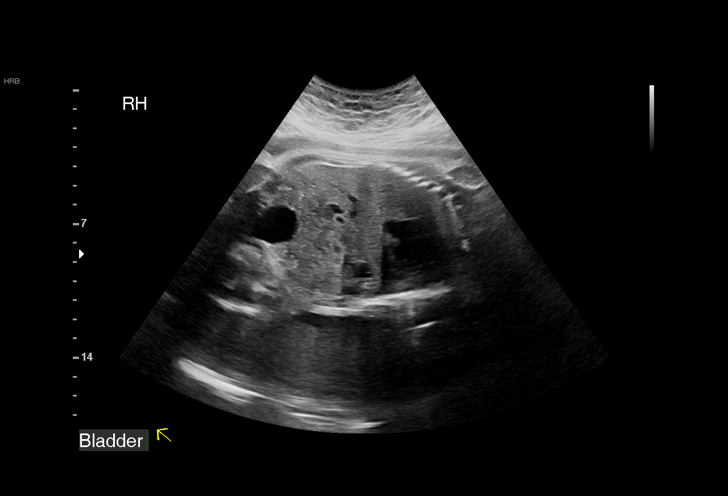
[im 5/20]
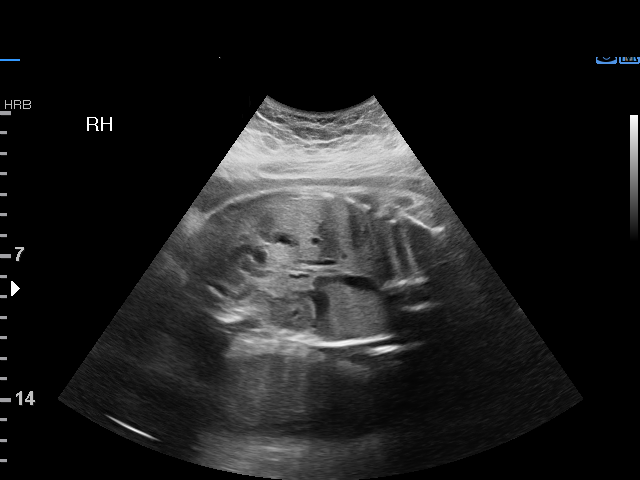
[im 7/20]
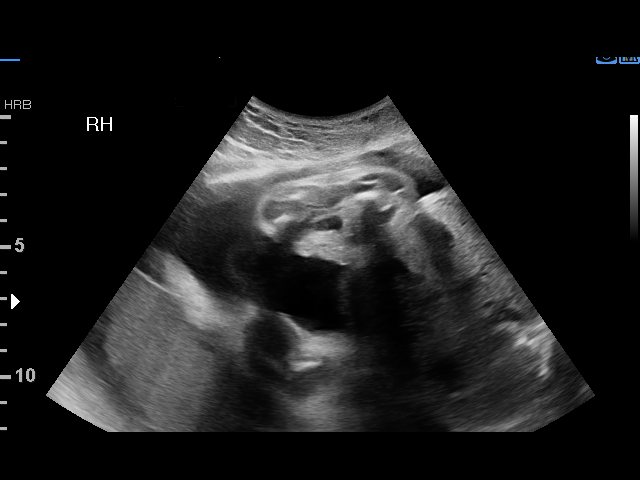
[im 8/20]
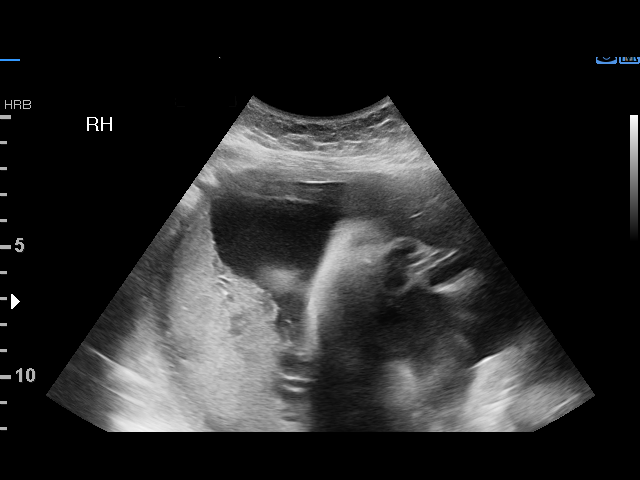
[im 9/20]
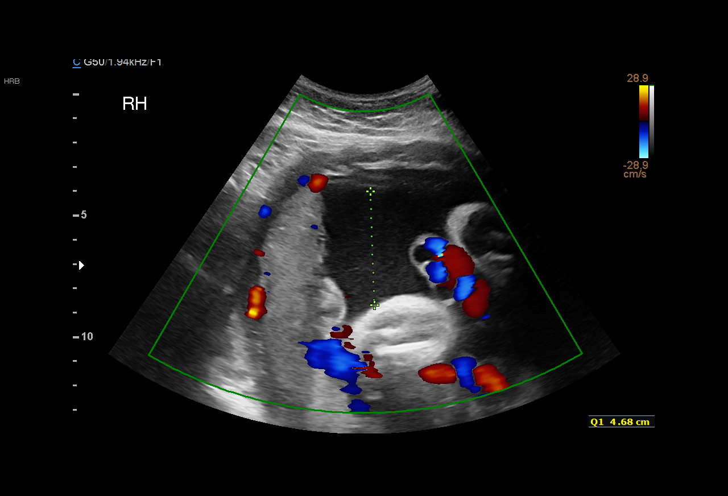
[im 11/20]
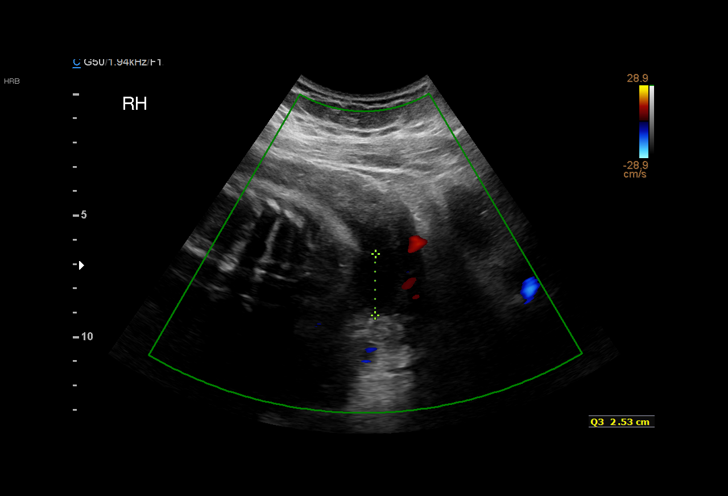
[im 12/20]
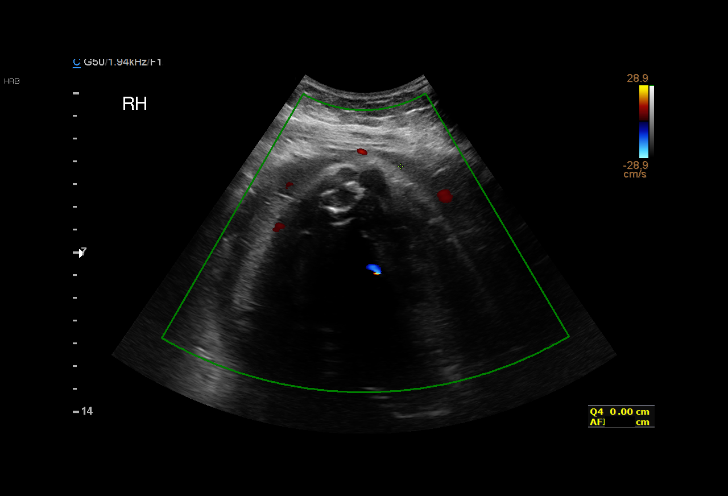
[im 13/20]
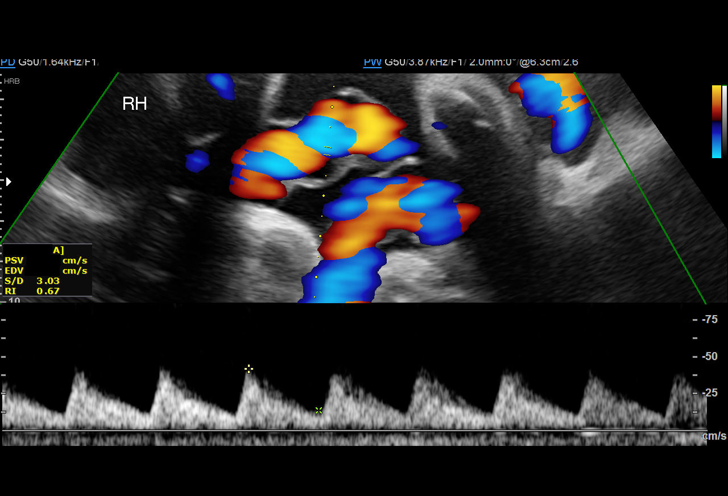
[im 15/20]
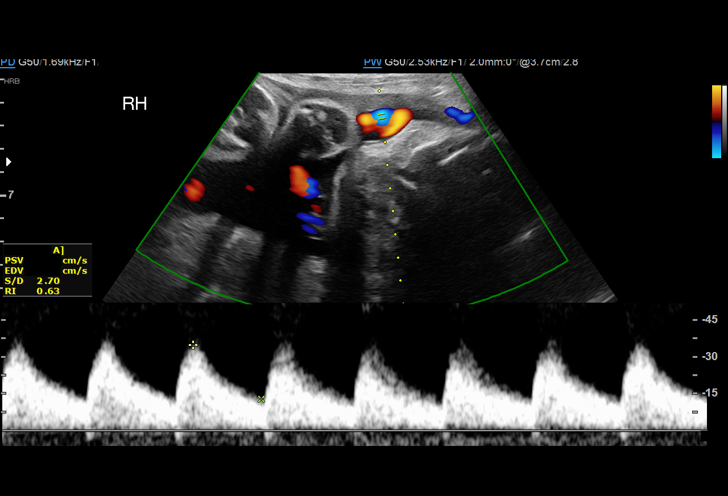
[im 16/20]
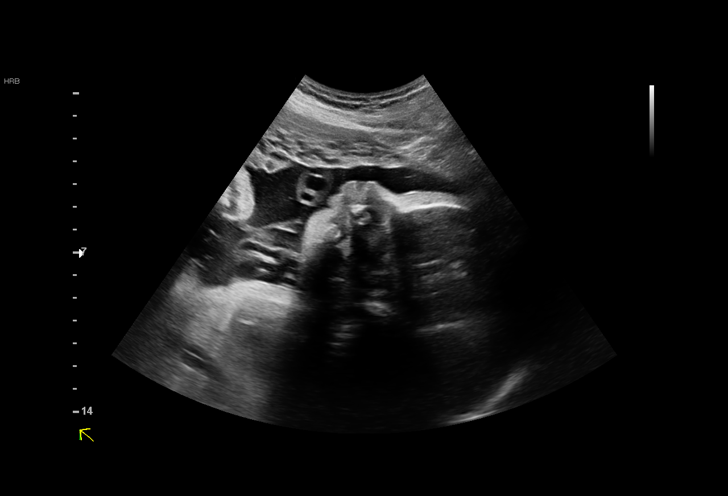
[im 17/20]
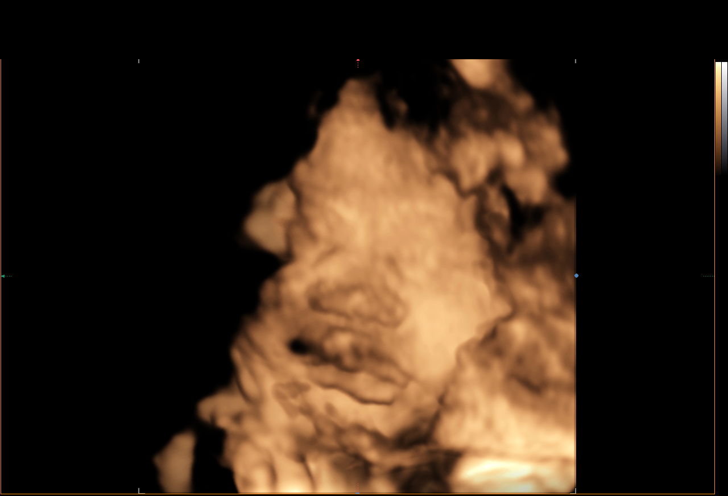
[im 19/20]
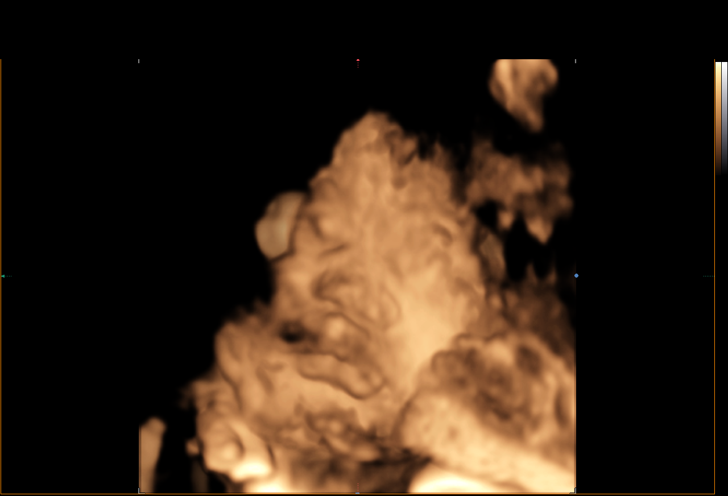
[im 20/20]
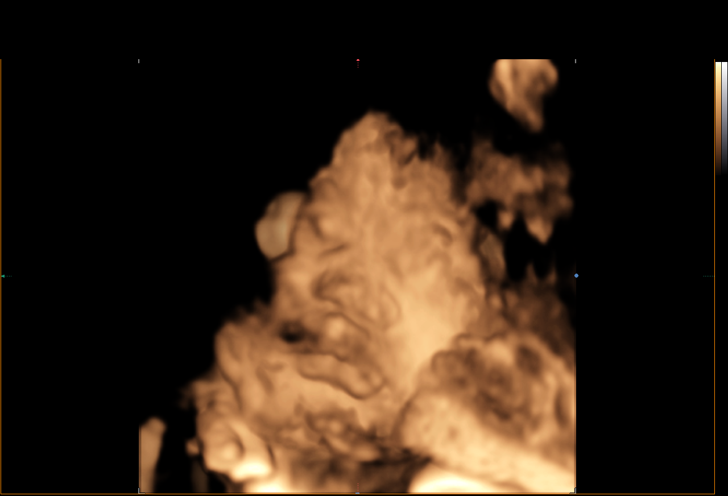

[15 of 20 positions shown; findings below may reference images not displayed]

W/NONSTRESS
 ----------------------------------------------------------------------

 ----------------------------------------------------------------------
Indications

  Maternal care for known or suspected poor
  fetal growth, third trimester, not applicable or
  unspecified
  Obesity complicating pregnancy, third
  trimester
  33 weeks gestation of pregnancy
 ----------------------------------------------------------------------
Vital Signs

 BMI:
Fetal Evaluation

 Num Of Fetuses:         1
 Fetal Heart Rate(bpm):  134
 Cardiac Activity:       Observed
 Presentation:           Cephalic

 Amniotic Fluid
 AFI FV:      Within normal limits

 AFI Sum(cm)     %Tile       Largest Pocket(cm)
 10.73           24

 RUQ(cm)       RLQ(cm)       LUQ(cm)        LLQ(cm)
 5.67          0
Biophysical Evaluation
 Amniotic F.V:   Within normal limits       F. Tone:        Observed
 F. Movement:    Observed                   N.S.T:          Reactive
 F. Breathing:   Not Observed               Score:          [DATE]
OB History

 Gravidity:    2
 TOP:          1        Living:  0
Gestational Age

 LMP:           33w 6d        Date:  04/01/18                 EDD:   01/06/19
 Best:          33w 6d     Det. By:  LMP  (04/01/18)          EDD:   01/06/19
Anatomy

 Thoracic:              Appears normal         Abdomen:                Appears normal
 Diaphragm:             Appears normal         Bladder:                Appears normal
 Stomach:               Appears normal, left
                        sided
Doppler - Fetal Vessels

 Umbilical Artery
  S/D     %tile
 3.07       79

Cervix Uterus Adnexa

 Cervix
 Not visualized (advanced GA >85wks)
Impression

 Severe fetal growth restriction. Patient returned for antenatal
 testing.
 Amniotic fluid is normal and good fetal activity is seen. Fetal
 breathing movements did not meet the criteria (BPP).
 Umbilical artery Doppler showed normal forward diastolic
 flow. NST is reactive. BPP [DATE].

 We reassured the patient of the findings.
Recommendations

 -Continue weekly BPP and Doppler till delivery.
                 Fanda, Aqaye

## 2021-01-10 IMAGING — US US MFM FETAL BPP W/O NON-STRESS
1 series · 15 of 28 positions shown · non-contrast
Comparison: none

[Series 1: us mfm fetal bpp w/o non-stress · 44 acquisitions, 15 frames shown]
[im 1/44]
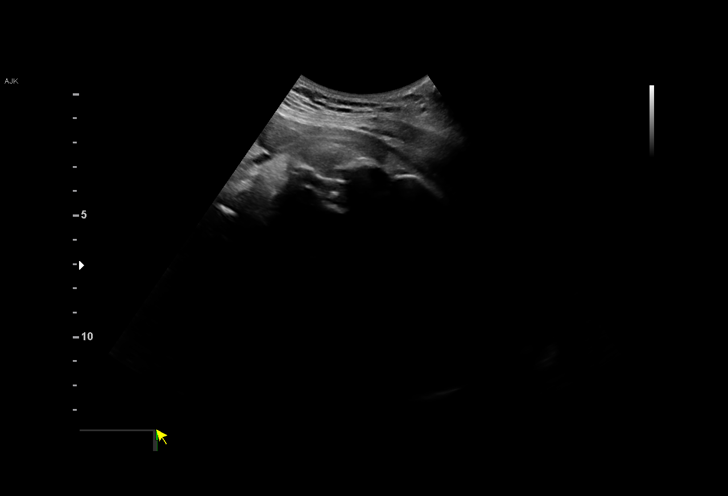
[im 4/44]
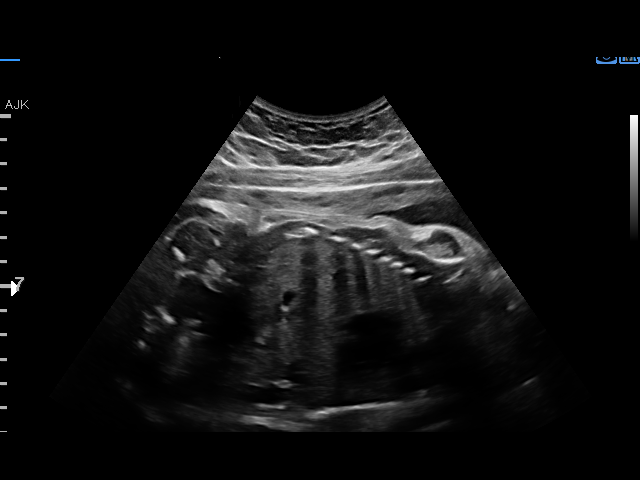
[im 7/44]
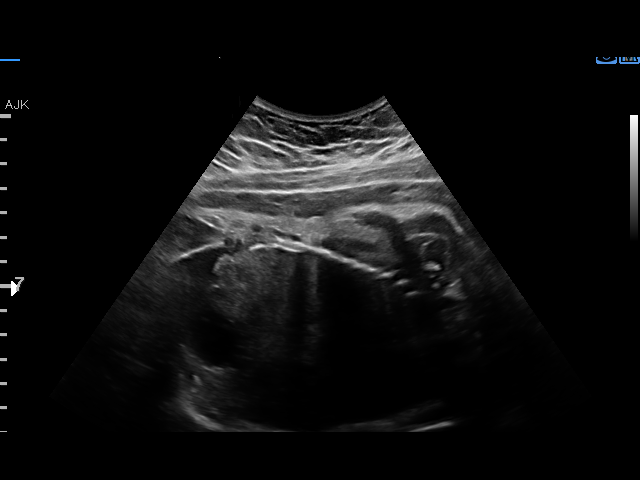
[im 10/44]
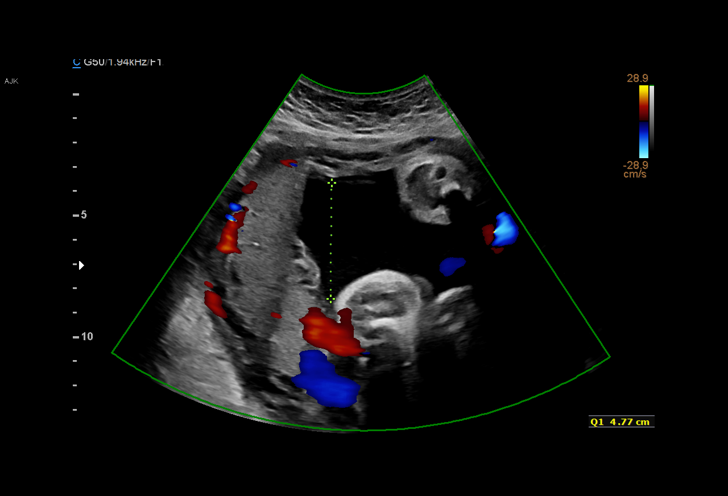
[im 13/44]
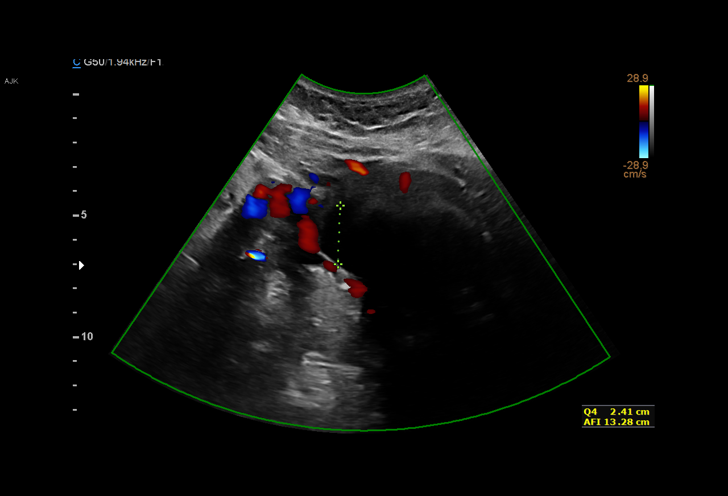
[im 16/44]
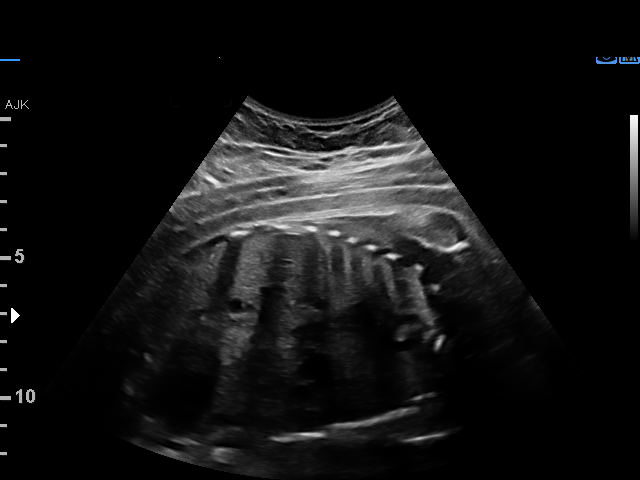
[im 20/44]
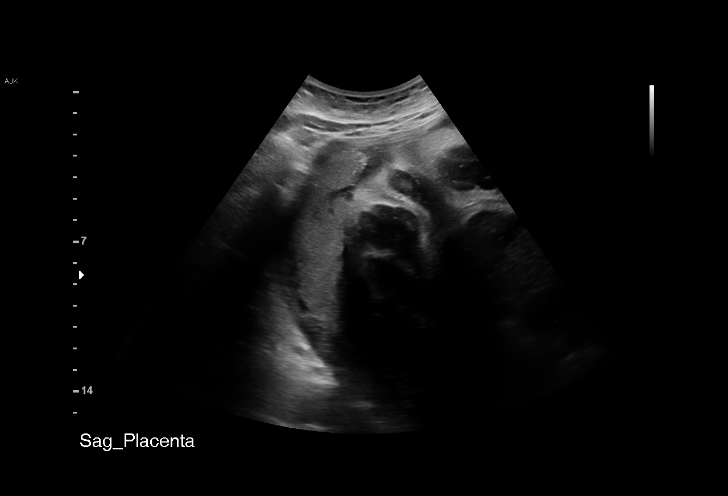
[im 23/44]
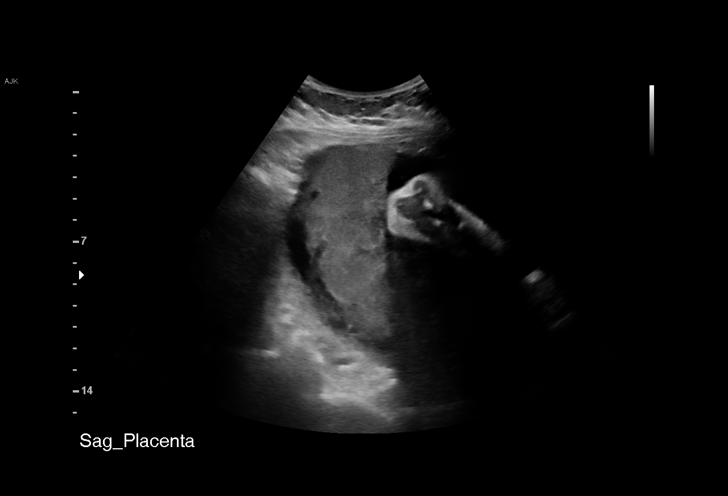
[im 24/44]
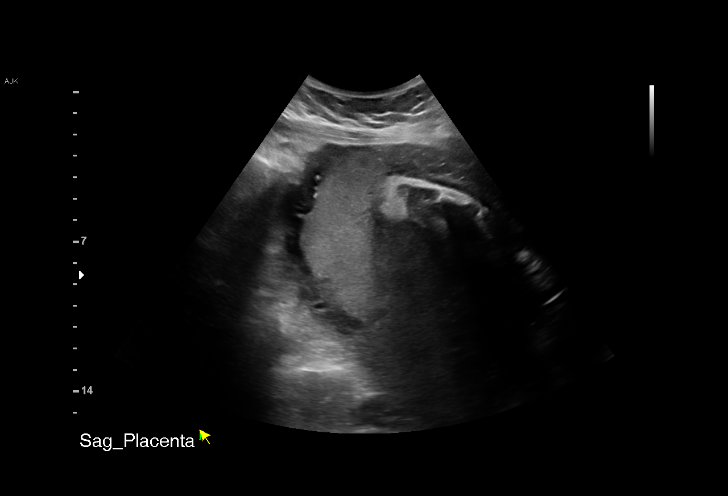
[im 28/44]
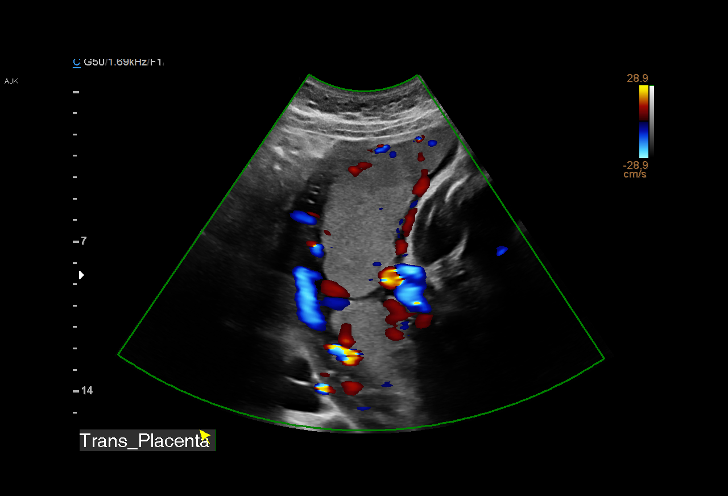
[im 31/44]
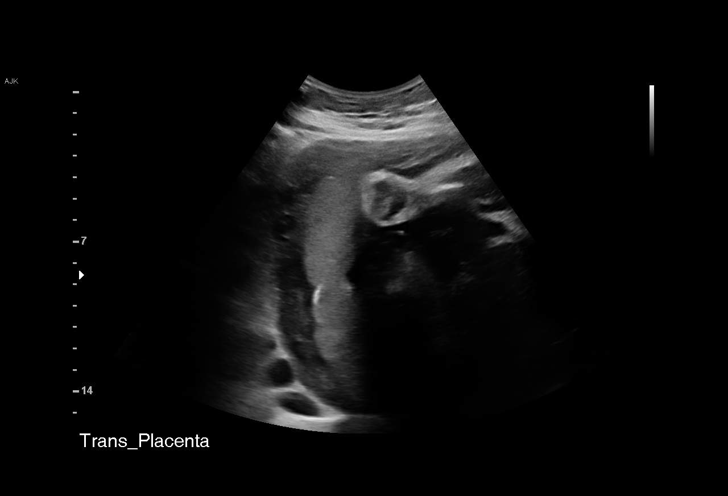
[im 34/44]
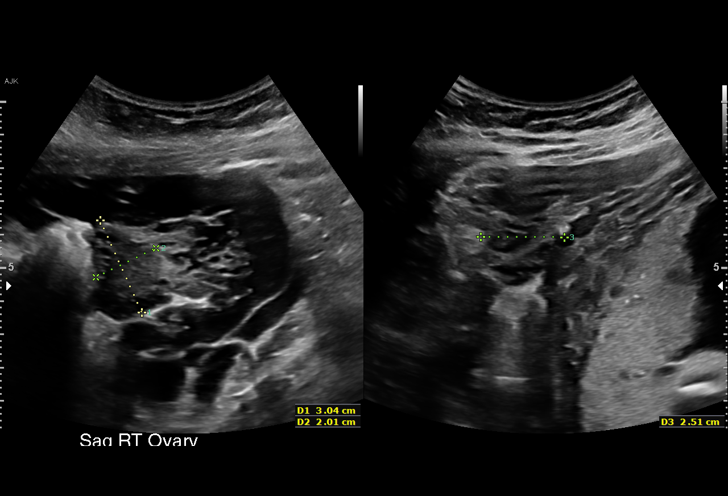
[im 37/44]
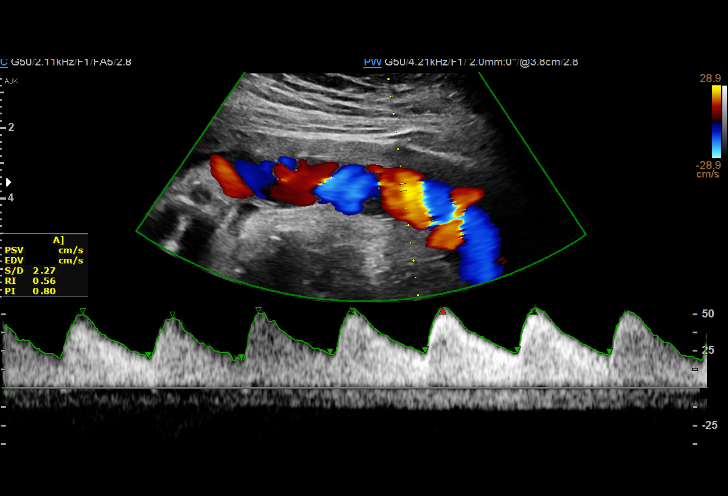
[im 40/44]
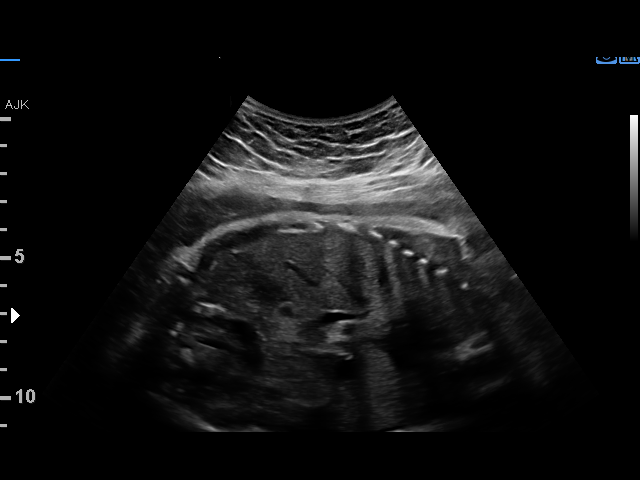
[im 44/44]
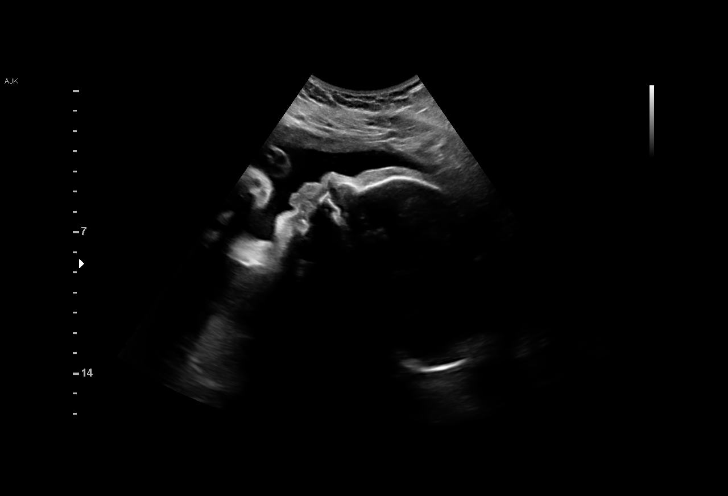

[15 of 28 positions shown; findings below may reference images not displayed]

----------------------------------------------------------------------

 ----------------------------------------------------------------------
Indications

  Maternal care for known or suspected poor
  fetal growth, third trimester, not applicable or
  unspecified
  Obesity complicating pregnancy, third
  trimester
  34 weeks gestation of pregnancy
 ----------------------------------------------------------------------
Vital Signs

                                                Height:        5'1"
Fetal Evaluation

 Num Of Fetuses:         1
 Fetal Heart Rate(bpm):  159
 Cardiac Activity:       Observed
 Presentation:           Cephalic
 Placenta:               Posterior
 P. Cord Insertion:      Visualized

 Amniotic Fluid
 AFI FV:      Within normal limits

 AFI Sum(cm)     %Tile       Largest Pocket(cm)
 13.27           44

 RUQ(cm)       RLQ(cm)       LUQ(cm)        LLQ(cm)


 Comment:    Stomach, bladder, and diaphragm noted.
Biophysical Evaluation

 Amniotic F.V:   Within normal limits       F. Tone:        Observed
 F. Movement:    Observed                   Score:          [DATE]
 F. Breathing:   Observed
OB History

 Gravidity:    2
 TOP:          1        Living:  0
Gestational Age

 LMP:           34w 6d        Date:  04/01/18                 EDD:   01/06/19
 Best:          34w 6d     Det. By:  LMP  (04/01/18)          EDD:   01/06/19
Doppler - Fetal Vessels

 Umbilical Artery
  S/D     %tile     RI              PI                     ADFV    RDFV
 2.36       42   0.58             0.83                        No      No

Comments

 A biophysical profile performed today due to an IUGR fetus
 was [DATE].
 There was normal amniotic fluid noted on today's ultrasound
 exam.
 Doppler studies of the umbilical arteries performed due to an
 IUGR fetus, showed a normal S/D ratio of 2.36.  There were
 no signs of absent or reversed end-diastolic flow.
 The patient was advised that should IUGR continue to be
 noted at her next growth ultrasound, delivery will be
 recommended at around 37 weeks.

## 2021-01-24 IMAGING — US US MFM FETAL BPP W/ NON-STRESS
1 series · 14 of 28 positions shown · non-contrast
Comparison: none

[Series 1: us mfm fetal bpp w/ non-stress · 46 acquisitions, 14 frames shown]
[im 2/46]
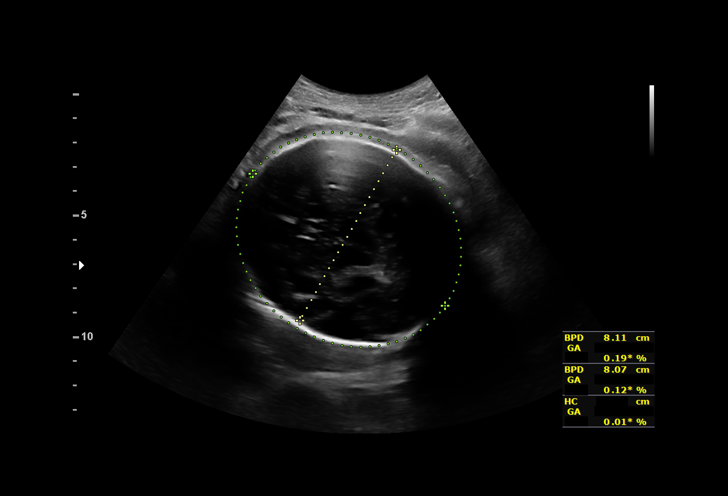
[im 6/46]
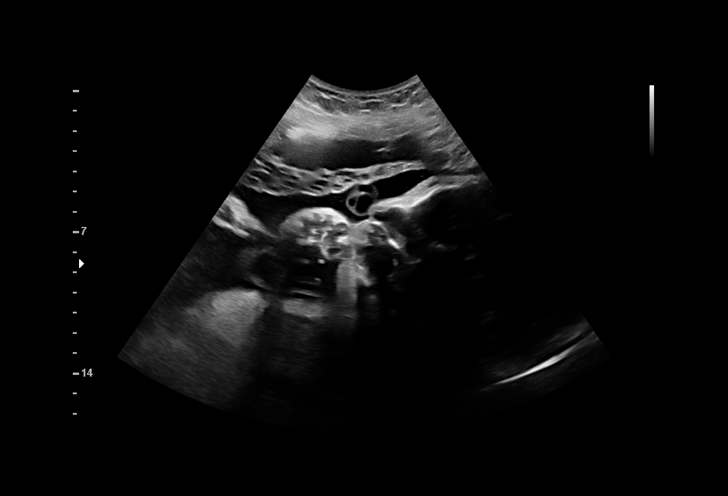
[im 9/46]
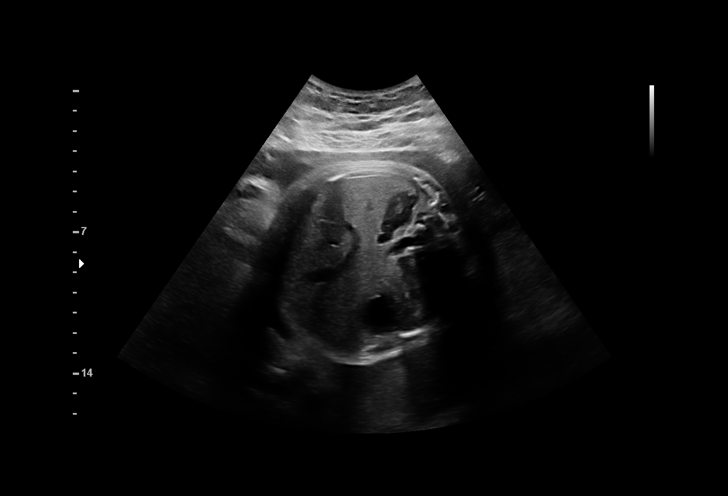
[im 12/46]
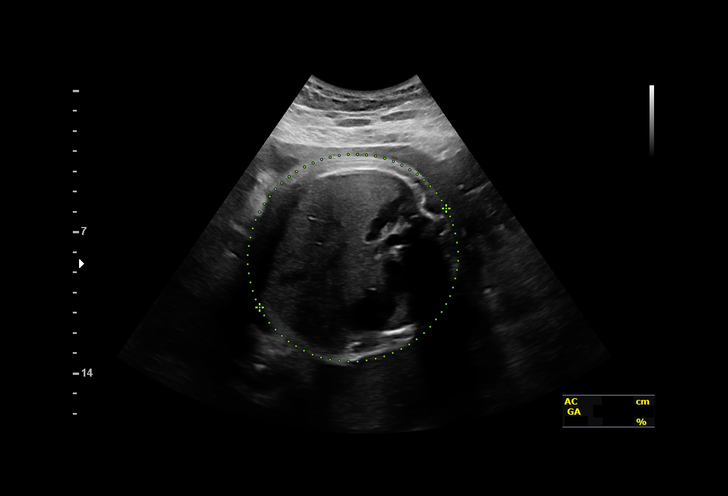
[im 16/46]
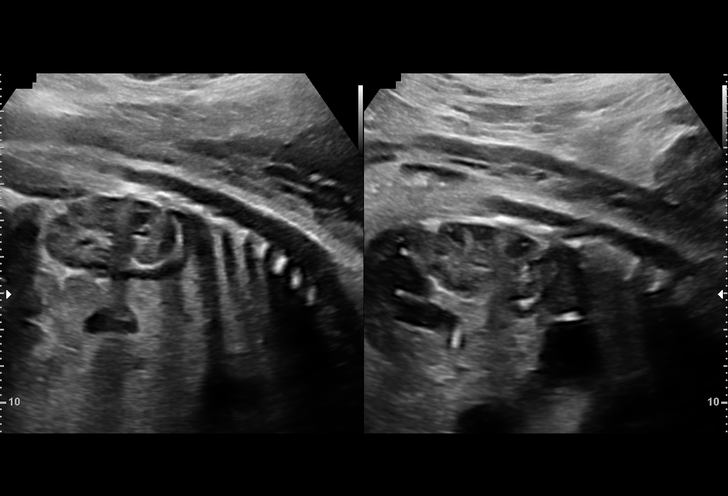
[im 19/46]
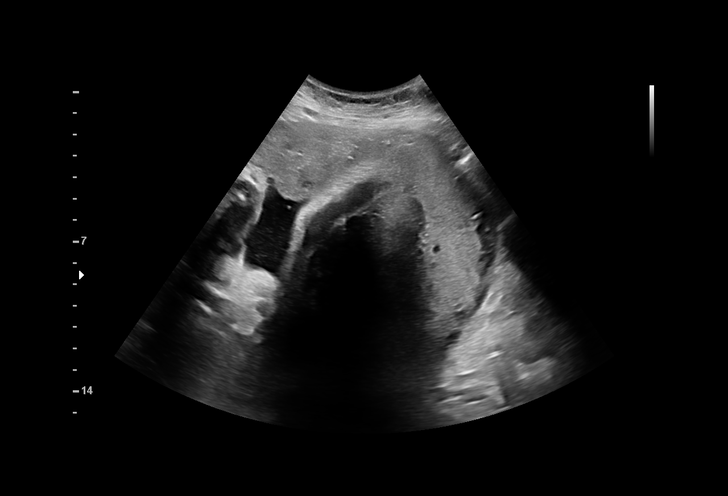
[im 22/46]
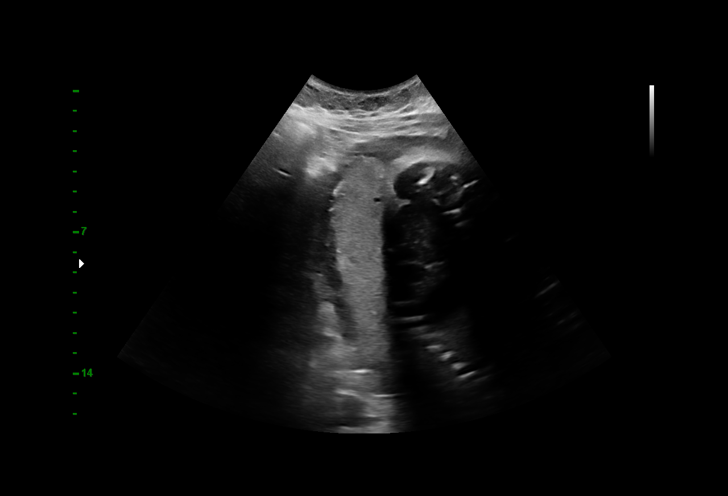
[im 26/46]
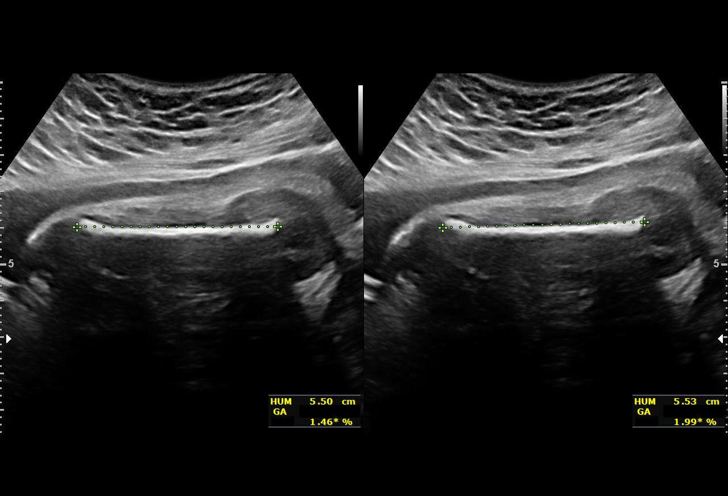
[im 29/46]
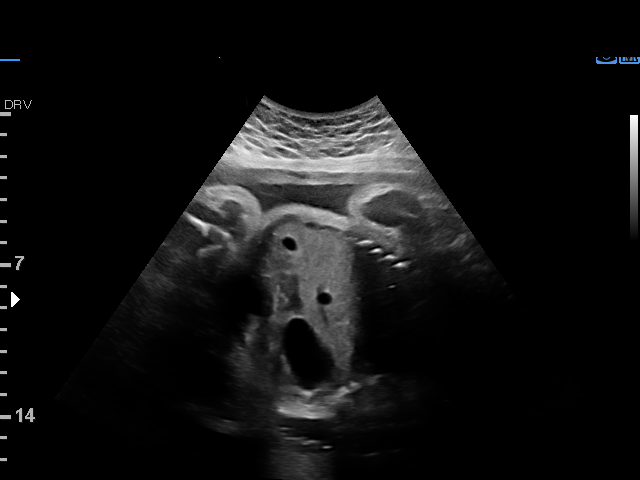
[im 32/46]
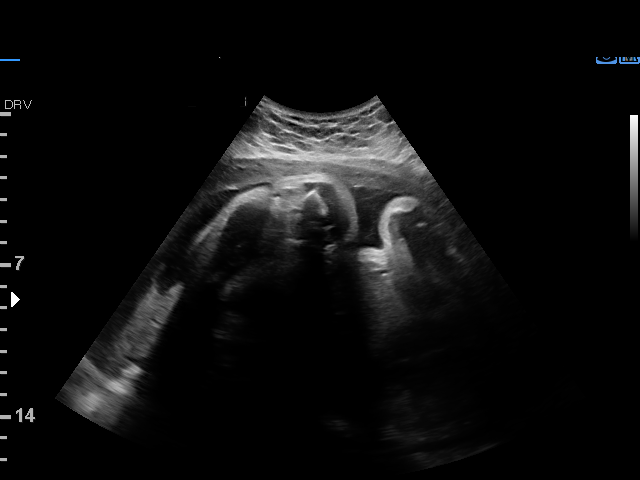
[im 36/46]
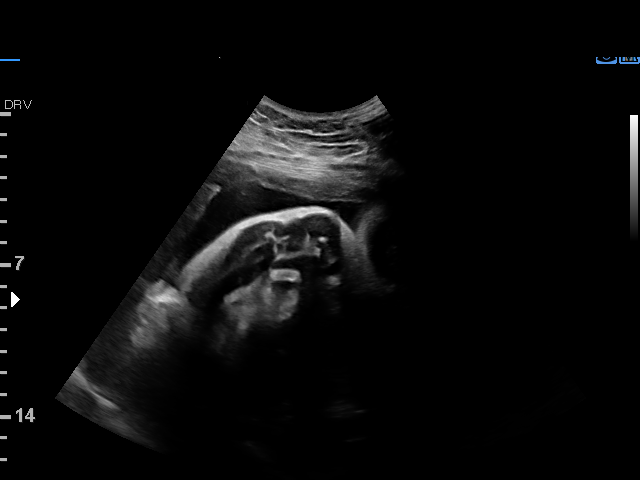
[im 39/46]
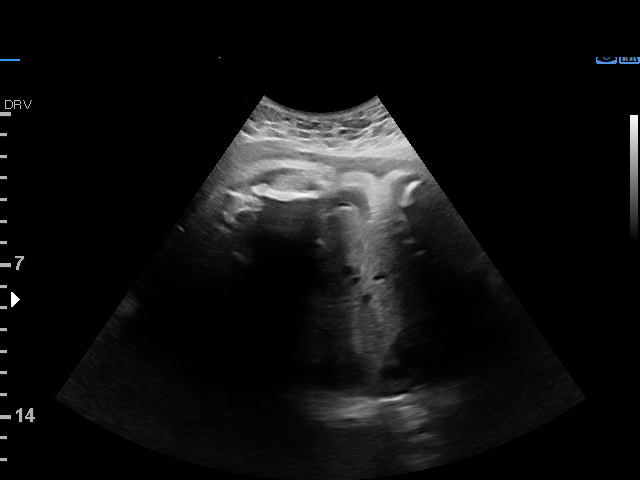
[im 42/46]
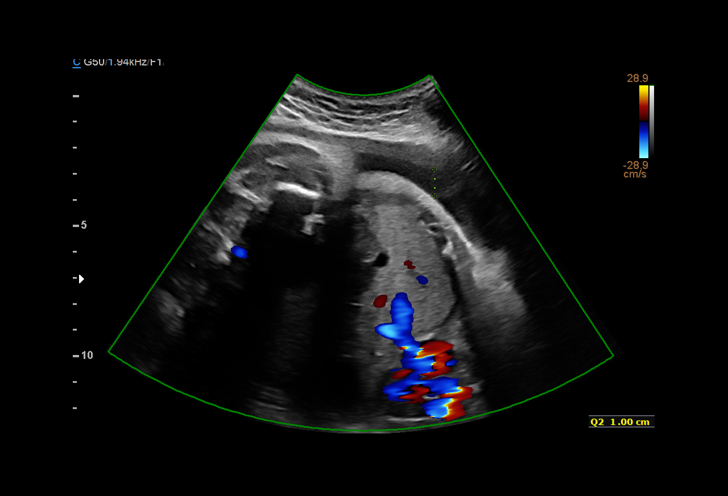
[im 46/46]
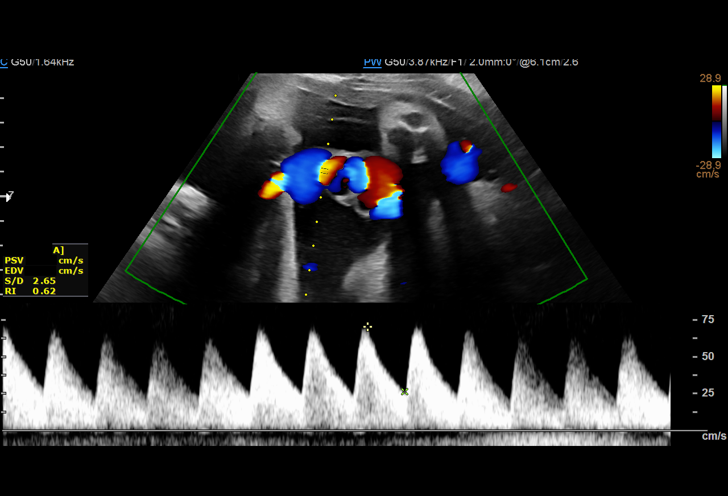

[14 of 28 positions shown; findings below may reference images not displayed]

W/NONSTRESS
 ----------------------------------------------------------------------

 ----------------------------------------------------------------------
Indications

  Maternal care for known or suspected poor
  fetal growth, third trimester, not applicable or
  unspecified
  36 weeks gestation of pregnancy
  Obesity complicating pregnancy, third
  trimester
  Encounter for other antenatal screening
  follow-up
 ----------------------------------------------------------------------
Vital Signs

                                                Height:        5'1"
Fetal Evaluation

 Num Of Fetuses:         1
 Cardiac Activity:       Observed
 Presentation:           Cephalic
 Placenta:               Posterior
 P. Cord Insertion:      Previously Visualized

 Amniotic Fluid
 AFI FV:      Within normal limits

 AFI Sum(cm)     %Tile       Largest Pocket(cm)
 7.9             8

 RUQ(cm)       RLQ(cm)       LUQ(cm)        LLQ(cm)
 2.84          0.92          1
Biophysical Evaluation

 Amniotic F.V:   Within normal limits       F. Tone:        Observed
 F. Movement:    Observed                   N.S.T:          Reactive
 F. Breathing:   Not Observed               Score:          [DATE]
Biometry

 BPD:      81.7  mm     G. Age:  32w 6d        < 1  %    CI:         80.2   %    70 - 86
                                                         FL/HC:      21.3   %    20.8 -
 HC:      288.2  mm     G. Age:  31w 5d        < 1  %    HC/AC:      0.89        0.92 -
 AC:      322.6  mm     G. Age:  36w 1d         43  %    FL/BPD:     75.3   %    71 - 87
 FL:       61.5  mm     G. Age:  31w 6d        < 1  %    FL/AC:      19.1   %    20 - 24
 HUM:      55.2  mm     G. Age:  32w 1d        < 5  %

 Est. FW:    1731  gm      5 lb 4 oz      5  %
OB History

 Gravidity:    2
 TOP:          1        Living:  0
Gestational Age

 LMP:           36w 6d        Date:  04/01/18                 EDD:   01/06/19
 U/S Today:     33w 1d                                        EDD:   02/01/19
 Best:          36w 6d     Det. By:  LMP  (04/01/18)          EDD:   01/06/19
Doppler - Fetal Vessels

 Umbilical Artery
  S/D     %tile                                            ADFV    RDFV
 2.16       36                                                 N       N

Cervix Uterus Adnexa

 Cervix
 Not visualized (advanced GA >39wks)
Impression

 Severe fetal growth restriction.  Patient return for fetal growth
 assessment and antenatal testing.  She does not have
 chronic hypertension or gestational diabetes.
 On ultrasound, amniotic fluid is normal and good fetal activity
 seen.  The estimated fetal weight is at the 5th percentile
 (interval weight gain 804 g over 4 weeks).  Fetal breathing
 movements did not meet the criteria BPP.  Umbilical artery
 Doppler study showed normal forward diastolic flow.  NST is
 reactive.  BPP [DATE].

 I explained the findings and discussed timing of delivery. I
 recommended delivery at 38 weeks. Patient agreed with my
 recommendations. She has a prenatal visit appointment on
 12/19/18 with Dr. Gultom and I will be contacting him ([REDACTED]).
Recommendations

 -Delivery at 38 weeks.
                 Billiot, Emme

## 2021-04-29 ENCOUNTER — Ambulatory Visit: Payer: BC Managed Care – PPO | Admitting: Plastic Surgery

## 2022-06-04 ENCOUNTER — Ambulatory Visit: Payer: Medicaid Other | Admitting: Podiatry

## 2022-06-04 ENCOUNTER — Encounter: Payer: Self-pay | Admitting: Podiatry

## 2022-06-04 DIAGNOSIS — L600 Ingrowing nail: Secondary | ICD-10-CM

## 2022-06-04 NOTE — Patient Instructions (Signed)

## 2022-06-06 NOTE — Progress Notes (Signed)
Subjective:   Patient ID: Stephanie Woods, female   DOB: 32 y.o.   MRN: [id number]   HPI Patient presents with painful ingrown toenail left big toe lateral side that is made it hard for her to wear shoe gear comfortably she has not had drainage or bleeding.  Does not smoke tries to be active   Review of Systems  All other systems reviewed and are negative.       Objective:  Physical Exam Vitals and nursing note reviewed.  Constitutional:      Appearance: She is well-developed.  Pulmonary:     Effort: Pulmonary effort is normal.  Musculoskeletal:        General: Normal range of motion.  Skin:    General: Skin is warm.  Neurological:     Mental Status: She is alert.     Neurovascular status intact muscle strength adequate range of motion adequate with incurvated left hallux lateral border thickening of the entire nailbed itself and moderate deformity also on the medial side.  Patient is found to have good digital perfusion well oriented x 3     Assessment:  Acute ingrown toenail deformity left hallux lateral border with pain with no current drainage and also structural damage to the nailbed itself     Plan:  H&P reviewed at great length.  Ultimately she may require removal of the entire nail but at this point organ to focus on the lateral border and I explained procedure and allowed her to read and signed consent form understanding risk.  I infiltrated the left hallux 60 mg Xylocaine Marcaine mixture sterile prep done using sterile instrumentation remove the lateral border exposed matrix applied phenol 3 applications 30 seconds followed by alcohol lavage sterile dressing gave instructions on soaks and to wear dressing 24 hours take it off earlier if throbbing were to occur and encouraged her to call with questions or concerns which may arise

## 2024-02-01 ENCOUNTER — Encounter (HOSPITAL_BASED_OUTPATIENT_CLINIC_OR_DEPARTMENT_OTHER)
Admission: RE | Admit: 2024-02-01 | Discharge: 2024-02-01 | Disposition: A | Discharge status: Home / Self Care | Source: Ambulatory Visit | Attending: Plastic Surgery | Admitting: Plastic Surgery

## 2024-02-01 ENCOUNTER — Encounter (HOSPITAL_BASED_OUTPATIENT_CLINIC_OR_DEPARTMENT_OTHER): Payer: Self-pay | Admitting: Plastic Surgery

## 2024-02-01 LAB — CBC WITH DIFFERENTIAL/PLATELET
Abs Immature Granulocytes: 0.01 K/uL (ref 0.00–0.07)
Basophils Absolute: 0.1 K/uL (ref 0.0–0.1)
Basophils Relative: 1 %
Eosinophils Absolute: 0 K/uL (ref 0.0–0.5)
Eosinophils Relative: 1 %
HCT: 40.5 % (ref 36.0–46.0)
Hemoglobin: 12.9 g/dL (ref 12.0–15.0)
Immature Granulocytes: 0 %
Lymphocytes Relative: 39 %
Lymphs Abs: 2.1 K/uL (ref 0.7–4.0)
MCH: 26.8 pg (ref 26.0–34.0)
MCHC: 31.9 g/dL (ref 30.0–36.0)
MCV: 84.2 fL (ref 80.0–100.0)
Monocytes Absolute: 0.5 K/uL (ref 0.1–1.0)
Monocytes Relative: 9 %
Neutro Abs: 2.8 K/uL (ref 1.7–7.7)
Neutrophils Relative %: 50 %
Platelets: 280 K/uL (ref 150–400)
RBC: 4.81 MIL/uL (ref 3.87–5.11)
RDW: 12.4 % (ref 11.5–15.5)
WBC: 5.4 K/uL (ref 4.0–10.5)
nRBC: 0 % (ref 0.0–0.2)

## 2024-02-01 MED ORDER — CHLORHEXIDINE GLUCONATE CLOTH 2 % EX PADS: 6.0000 | MEDICATED_PAD | Freq: Once | CUTANEOUS | Status: DC

## 2024-02-01 NOTE — H&P (Signed)
 Subjective Patient ID: Stephanie Woods is a 33 y.o. female.     HPI   Presents for consultation breast reduction. Current 38G. Reports several year neck and back pain, shoulder and bra indentions, sensation being pulled forward/worsening posture. Patient has tried specialty fitted bras, OTC pain medication, chiropractor treatments, regular activity for over 3 month trial without effect. Denies rashes, numbness hands. She reports affecting ADL as pain exacerbated with carrying work bag (travels to appointments with clients, carries computer).   Wt stable   No prior MMG. Denies FH breast or ovarian ca.   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 Also works at [employer]. Lives with 33 yo daugther. Mother and aunt would assist with post op care. Mother herself has had breast reduction. Patient states done having kids.    Review of Systems  Endocrine: Positive for polydipsia.  Musculoskeletal:  Positive for back pain and neck pain.  Psychiatric/Behavioral:  The patient is nervous/anxious.   All other systems reviewed and are negative.    Objective Physical Exam  Cardiovascular: Normal rate, regular rhythm and normal heart sounds.    Pulmonary/Chest Effort normal and breath sounds normal.    Skin   Fitzpatrick 6     Lymph: no palpable axillary adenopathy   +shoulder grooving Breasts: no palpable masses, grade 3 ptosis bilateral SN to nipple R 35 L 35 cm BW R 27 L 27 cm Nipple to IMF R 16 L 16 cm   Assessment/Plan Macromastia Chronic neck and back pain   At her age and in absence of FH do not require MMG prior to surgery.    Reports occasional vaping with nicotine. Reviewed increased risks complications with nicotine use including wound healing problems NAC necrosis and recommend refrain from all nicotine use.  Chronic neck and back pain, in setting of macromastia that has failed conservative management over 3 month trial. The pain is not  related to other diagnoses.There is a reasonable likelihood that the patient's symptoms are primarily due to macromastia. Breast reduction is likely to result in improvement of the chronic pain.  Reviewed reduction with anchor type scars, OP surgery, drains, post operative visits and limitations, recovery. Diminished sensation nipple and breast skin, risk of nipple loss, wound healing problems, asymmetry, incidental carcinoma, changes with wt gain/loss, aging, unacceptable cosmetic appearance reviewed. Reviewed changes with pregnancy, effect on ability to breast feed. Reviewed scar maturation over months.  Reviewed cannot assure cup size. Discussed any complication with NAC would preclude her ability to breast feed in future.   Additional risks including but not limited to bleeding, hematoma, seroma, infection, wound healing problems, need for additional surgery, damage to adjacent structures, blood clots in legs or lung reviewed. Completed ASPS consent for breast reduction.    Drain teaching completed. Rx for tramadol given. History anemia. Preop CBC ordered. Instructed to go to lab today.  Anticipate >350 g reduction from each breast.   Earlis Ranks, MD [facility]  Office/ physician access line after hours [phone]

## 2024-02-01 NOTE — Progress Notes (Signed)

## 2024-02-10 ENCOUNTER — Encounter (HOSPITAL_BASED_OUTPATIENT_CLINIC_OR_DEPARTMENT_OTHER): Payer: Self-pay | Admitting: Plastic Surgery

## 2024-02-10 ENCOUNTER — Other Ambulatory Visit: Payer: Self-pay

## 2024-02-10 ENCOUNTER — Ambulatory Visit (HOSPITAL_BASED_OUTPATIENT_CLINIC_OR_DEPARTMENT_OTHER): Payer: Self-pay | Admitting: Anesthesiology

## 2024-02-10 ENCOUNTER — Encounter (HOSPITAL_BASED_OUTPATIENT_CLINIC_OR_DEPARTMENT_OTHER): Payer: Self-pay | Admitting: Anesthesiology

## 2024-02-10 ENCOUNTER — Encounter (HOSPITAL_BASED_OUTPATIENT_CLINIC_OR_DEPARTMENT_OTHER): Admission: RE | Disposition: A | Payer: Self-pay | Source: Home / Self Care | Attending: Plastic Surgery

## 2024-02-10 ENCOUNTER — Ambulatory Visit (HOSPITAL_BASED_OUTPATIENT_CLINIC_OR_DEPARTMENT_OTHER)
Admission: RE | Admit: 2024-02-10 | Discharge: 2024-02-10 | Disposition: A | Discharge status: Home / Self Care | Attending: Plastic Surgery | Admitting: Plastic Surgery

## 2024-02-10 DIAGNOSIS — N62 Hypertrophy of breast: Secondary | ICD-10-CM | POA: Insufficient documentation

## 2024-02-10 DIAGNOSIS — Z6839 Body mass index (BMI) 39.0-39.9, adult: Secondary | ICD-10-CM | POA: Diagnosis not present

## 2024-02-10 DIAGNOSIS — N6032 Fibrosclerosis of left breast: Secondary | ICD-10-CM | POA: Insufficient documentation

## 2024-02-10 DIAGNOSIS — E669 Obesity, unspecified: Secondary | ICD-10-CM | POA: Insufficient documentation

## 2024-02-10 DIAGNOSIS — I10 Essential (primary) hypertension: Secondary | ICD-10-CM | POA: Insufficient documentation

## 2024-02-10 DIAGNOSIS — J45909 Unspecified asthma, uncomplicated: Secondary | ICD-10-CM | POA: Diagnosis not present

## 2024-02-10 DIAGNOSIS — Z87891 Personal history of nicotine dependence: Secondary | ICD-10-CM | POA: Diagnosis not present

## 2024-02-10 DIAGNOSIS — D649 Anemia, unspecified: Secondary | ICD-10-CM

## 2024-02-10 DIAGNOSIS — N6021 Fibroadenosis of right breast: Secondary | ICD-10-CM | POA: Insufficient documentation

## 2024-02-10 DIAGNOSIS — M5489 Other dorsalgia: Secondary | ICD-10-CM | POA: Diagnosis present

## 2024-02-10 DIAGNOSIS — M542 Cervicalgia: Secondary | ICD-10-CM | POA: Diagnosis present

## 2024-02-10 DIAGNOSIS — M549 Dorsalgia, unspecified: Secondary | ICD-10-CM | POA: Insufficient documentation

## 2024-02-10 DIAGNOSIS — Z01818 Encounter for other preprocedural examination: Secondary | ICD-10-CM

## 2024-02-10 DIAGNOSIS — G8929 Other chronic pain: Secondary | ICD-10-CM | POA: Insufficient documentation

## 2024-02-10 DIAGNOSIS — N6031 Fibrosclerosis of right breast: Secondary | ICD-10-CM | POA: Diagnosis not present

## 2024-02-10 HISTORY — PX: BREAST REDUCTION SURGERY: SHX8

## 2024-02-10 LAB — POCT PREGNANCY, URINE: Preg Test, Ur: NEGATIVE

## 2024-02-10 SURGERY — MAMMOPLASTY, REDUCTION
Anesthesia: General | Site: Breast | Laterality: Bilateral

## 2024-02-10 MED ORDER — ACETAMINOPHEN 500 MG PO TABS
ORAL_TABLET | ORAL | Status: AC
  Filled 2024-02-10: qty 2

## 2024-02-10 MED ORDER — MIDAZOLAM HCL 5 MG/5ML IJ SOLN
INTRAMUSCULAR | Status: DC | PRN
  Administered 2024-02-10: 2 mg via INTRAVENOUS

## 2024-02-10 MED ORDER — SUGAMMADEX SODIUM 200 MG/2ML IV SOLN
INTRAVENOUS | Status: DC | PRN
  Administered 2024-02-10: 200 mg via INTRAVENOUS

## 2024-02-10 MED ORDER — 0.9 % SODIUM CHLORIDE (POUR BTL) OPTIME
TOPICAL | Status: DC | PRN
  Administered 2024-02-10: 1000 mL

## 2024-02-10 MED ORDER — CEFAZOLIN SODIUM-DEXTROSE 2-4 GM/100ML-% IV SOLN
INTRAVENOUS | Status: AC
  Filled 2024-02-10: qty 100

## 2024-02-10 MED ORDER — HYDROMORPHONE HCL 1 MG/ML IJ SOLN
INTRAMUSCULAR | Status: AC
  Filled 2024-02-10: qty 0.5

## 2024-02-10 MED ORDER — CELECOXIB 200 MG PO CAPS
200.0000 mg | ORAL_CAPSULE | ORAL | Status: AC
  Administered 2024-02-10: 200 mg via ORAL

## 2024-02-10 MED ORDER — ONDANSETRON HCL 4 MG/2ML IJ SOLN: 4.0000 mg | Freq: Once | INTRAMUSCULAR | Status: DC | PRN

## 2024-02-10 MED ORDER — DIPHENHYDRAMINE HCL 50 MG/ML IJ SOLN
INTRAMUSCULAR | Status: DC | PRN
  Administered 2024-02-10: 6.25 mg via INTRAVENOUS

## 2024-02-10 MED ORDER — LIDOCAINE HCL (CARDIAC) PF 100 MG/5ML IV SOSY
PREFILLED_SYRINGE | INTRAVENOUS | Status: DC | PRN
  Administered 2024-02-10: 100 mg via INTRAVENOUS

## 2024-02-10 MED ORDER — ROCURONIUM BROMIDE 10 MG/ML (PF) SYRINGE
PREFILLED_SYRINGE | INTRAVENOUS | Status: AC
  Filled 2024-02-10: qty 10

## 2024-02-10 MED ORDER — ROCURONIUM BROMIDE 100 MG/10ML IV SOLN
INTRAVENOUS | Status: DC | PRN
  Administered 2024-02-10: 60 mg via INTRAVENOUS

## 2024-02-10 MED ORDER — FENTANYL CITRATE (PF) 100 MCG/2ML IJ SOLN
INTRAMUSCULAR | Status: AC
  Filled 2024-02-10: qty 2

## 2024-02-10 MED ORDER — ATROPINE SULFATE (PF) 0.4 MG/ML IJ SOLN
INTRAMUSCULAR | Status: AC
  Filled 2024-02-10: qty 1

## 2024-02-10 MED ORDER — GABAPENTIN 300 MG PO CAPS
ORAL_CAPSULE | ORAL | Status: AC
  Filled 2024-02-10: qty 1

## 2024-02-10 MED ORDER — BUPIVACAINE HCL (PF) 0.5 % IJ SOLN
INTRAMUSCULAR | Status: AC
  Filled 2024-02-10: qty 30

## 2024-02-10 MED ORDER — DEXAMETHASONE SODIUM PHOSPHATE 4 MG/ML IJ SOLN
INTRAMUSCULAR | Status: DC | PRN
  Administered 2024-02-10: 5 mg via INTRAVENOUS

## 2024-02-10 MED ORDER — MIDAZOLAM HCL 2 MG/2ML IJ SOLN
INTRAMUSCULAR | Status: AC
  Filled 2024-02-10: qty 2

## 2024-02-10 MED ORDER — ONDANSETRON HCL 4 MG/2ML IJ SOLN
INTRAMUSCULAR | Status: AC
  Filled 2024-02-10: qty 2

## 2024-02-10 MED ORDER — SCOPOLAMINE 1 MG/3DAYS TD PT72
MEDICATED_PATCH | TRANSDERMAL | Status: AC
  Filled 2024-02-10: qty 1

## 2024-02-10 MED ORDER — SCOPOLAMINE 1 MG/3DAYS TD PT72
1.0000 | MEDICATED_PATCH | TRANSDERMAL | Status: DC
  Administered 2024-02-10: 1 mg via TRANSDERMAL

## 2024-02-10 MED ORDER — HYDROMORPHONE HCL 1 MG/ML IJ SOLN
INTRAMUSCULAR | Status: DC | PRN
  Administered 2024-02-10 (×2): .5 mg via INTRAVENOUS

## 2024-02-10 MED ORDER — LIDOCAINE 2% (20 MG/ML) 5 ML SYRINGE
INTRAMUSCULAR | Status: AC
  Filled 2024-02-10: qty 5

## 2024-02-10 MED ORDER — PROPOFOL 10 MG/ML IV BOLUS
INTRAVENOUS | Status: DC | PRN
  Administered 2024-02-10: 200 mg via INTRAVENOUS

## 2024-02-10 MED ORDER — CELECOXIB 200 MG PO CAPS
ORAL_CAPSULE | ORAL | Status: AC
  Filled 2024-02-10: qty 1

## 2024-02-10 MED ORDER — SUCCINYLCHOLINE CHLORIDE 200 MG/10ML IV SOSY
PREFILLED_SYRINGE | INTRAVENOUS | Status: AC
  Filled 2024-02-10: qty 10

## 2024-02-10 MED ORDER — DEXMEDETOMIDINE HCL IN NACL 80 MCG/20ML IV SOLN
INTRAVENOUS | Status: DC | PRN
  Administered 2024-02-10: 8 ug via INTRAVENOUS

## 2024-02-10 MED ORDER — FENTANYL CITRATE (PF) 100 MCG/2ML IJ SOLN
25.0000 ug | INTRAMUSCULAR | Status: DC | PRN
  Administered 2024-02-10 (×2): 25 ug via INTRAVENOUS

## 2024-02-10 MED ORDER — ACETAMINOPHEN 500 MG PO TABS
1000.0000 mg | ORAL_TABLET | ORAL | Status: AC
  Administered 2024-02-10: 1000 mg via ORAL

## 2024-02-10 MED ORDER — DEXMEDETOMIDINE HCL IN NACL 80 MCG/20ML IV SOLN
INTRAVENOUS | Status: AC
  Filled 2024-02-10: qty 20

## 2024-02-10 MED ORDER — EPHEDRINE 5 MG/ML INJ
INTRAVENOUS | Status: AC
  Filled 2024-02-10: qty 5

## 2024-02-10 MED ORDER — OXYCODONE HCL 5 MG PO TABS
5.0000 mg | ORAL_TABLET | Freq: Once | ORAL | Status: AC
  Administered 2024-02-10: 5 mg via ORAL

## 2024-02-10 MED ORDER — DIPHENHYDRAMINE HCL 50 MG/ML IJ SOLN
INTRAMUSCULAR | Status: AC
  Filled 2024-02-10: qty 1

## 2024-02-10 MED ORDER — PHENYLEPHRINE 80 MCG/ML (10ML) SYRINGE FOR IV PUSH (FOR BLOOD PRESSURE SUPPORT)
PREFILLED_SYRINGE | INTRAVENOUS | Status: AC
  Filled 2024-02-10: qty 10

## 2024-02-10 MED ORDER — CEFAZOLIN SODIUM-DEXTROSE 2-4 GM/100ML-% IV SOLN
2.0000 g | INTRAVENOUS | Status: AC
  Administered 2024-02-10: 2 g via INTRAVENOUS

## 2024-02-10 MED ORDER — LACTATED RINGERS IV SOLN: INTRAVENOUS | Status: DC

## 2024-02-10 MED ORDER — BUPIVACAINE HCL (PF) 0.5 % IJ SOLN
INTRAMUSCULAR | Status: DC | PRN
  Administered 2024-02-10: 30 mL

## 2024-02-10 MED ORDER — GABAPENTIN 300 MG PO CAPS
300.0000 mg | ORAL_CAPSULE | ORAL | Status: AC
  Administered 2024-02-10: 300 mg via ORAL

## 2024-02-10 MED ORDER — ONDANSETRON HCL 4 MG/2ML IJ SOLN
INTRAMUSCULAR | Status: DC | PRN
  Administered 2024-02-10: 4 mg via INTRAVENOUS

## 2024-02-10 MED ORDER — FENTANYL CITRATE (PF) 100 MCG/2ML IJ SOLN
INTRAMUSCULAR | Status: DC | PRN
  Administered 2024-02-10 (×2): 100 ug via INTRAVENOUS

## 2024-02-10 MED ORDER — OXYCODONE HCL 5 MG PO TABS
ORAL_TABLET | ORAL | Status: AC
  Filled 2024-02-10: qty 1

## 2024-02-10 MED ORDER — AMISULPRIDE (ANTIEMETIC) 5 MG/2ML IV SOLN: 10.0000 mg | Freq: Once | INTRAVENOUS | Status: DC | PRN

## 2024-02-10 SURGICAL SUPPLY — 45 items
BINDER BREAST 3XL (GAUZE/BANDAGES/DRESSINGS) IMPLANT
BINDER BREAST LRG (GAUZE/BANDAGES/DRESSINGS) IMPLANT
BINDER BREAST MEDIUM (GAUZE/BANDAGES/DRESSINGS) IMPLANT
BINDER BREAST XLRG (GAUZE/BANDAGES/DRESSINGS) IMPLANT
BINDER BREAST XXLRG (GAUZE/BANDAGES/DRESSINGS) IMPLANT
BLADE SURG 10 STRL SS (BLADE) ×4 IMPLANT
BNDG GAUZE DERMACEA FLUFF 4 (GAUZE/BANDAGES/DRESSINGS) ×2 IMPLANT
CANISTER SUCT 1200ML W/VALVE (MISCELLANEOUS) ×1 IMPLANT
CHLORAPREP W/TINT 26 (MISCELLANEOUS) ×2 IMPLANT
COVER BACK TABLE 60X90IN (DRAPES) ×1 IMPLANT
COVER MAYO STAND STRL (DRAPES) ×1 IMPLANT
DERMABOND ADVANCED .7 DNX12 (GAUZE/BANDAGES/DRESSINGS) ×2 IMPLANT
DRAIN CHANNEL 15F RND FF W/TCR (WOUND CARE) IMPLANT
DRAIN CHANNEL 19F RND (DRAIN) IMPLANT
DRAPE TOP ARMCOVERS (MISCELLANEOUS) ×1 IMPLANT
DRAPE U-SHAPE 76X120 STRL (DRAPES) ×1 IMPLANT
DRAPE UTILITY XL STRL (DRAPES) ×1 IMPLANT
ELECT COATED BLADE 2.86 ST (ELECTRODE) ×1 IMPLANT
ELECTRODE REM PT RTRN 9FT ADLT (ELECTROSURGICAL) ×1 IMPLANT
EVACUATOR SILICONE 100CC (DRAIN) IMPLANT
GAUZE PAD ABD 8X10 STRL (GAUZE/BANDAGES/DRESSINGS) ×2 IMPLANT
GLOVE BIO SURGEON STRL SZ 6 (GLOVE) ×2 IMPLANT
GOWN STRL REUS W/ TWL LRG LVL3 (GOWN DISPOSABLE) ×2 IMPLANT
MARKER SKIN DUAL TIP RULER LAB (MISCELLANEOUS) IMPLANT
NDL HYPO 25X1 1.5 SAFETY (NEEDLE) ×1 IMPLANT
NEEDLE HYPO 25X1 1.5 SAFETY (NEEDLE) ×1 IMPLANT
PACK BASIN DAY SURGERY FS (CUSTOM PROCEDURE TRAY) ×1 IMPLANT
PENCIL BUTTON HOLSTER BLD 10FT (ELECTRODE) IMPLANT
PENCIL SMOKE EVACUATOR (MISCELLANEOUS) ×1 IMPLANT
PIN SAFETY STERILE (MISCELLANEOUS) ×1 IMPLANT
SHEET MEDIUM DRAPE 40X70 STRL (DRAPES) ×1 IMPLANT
SLEEVE SCD COMPRESS KNEE MED (STOCKING) ×1 IMPLANT
SOLN 0.9% NACL POUR BTL 1000ML (IV SOLUTION) ×1 IMPLANT
SPONGE T-LAP 18X18 ~~LOC~~+RFID (SPONGE) ×3 IMPLANT
STAPLER SKIN PROX WIDE 3.9 (STAPLE) ×1 IMPLANT
SUT ETHILON 2 0 FS 18 (SUTURE) IMPLANT
SUT MNCRL AB 3-0 PS2 18 (SUTURE) IMPLANT
SUT MNCRL AB 4-0 PS2 18 (SUTURE) IMPLANT
SUT VIC AB 3-0 PS1 18XBRD (SUTURE) IMPLANT
SYR BULB IRRIG 60ML STRL (SYRINGE) ×1 IMPLANT
SYR CONTROL 10ML LL (SYRINGE) ×1 IMPLANT
TOWEL GREEN STERILE FF (TOWEL DISPOSABLE) ×2 IMPLANT
TUBE CONNECTING 20X1/4 (TUBING) ×1 IMPLANT
UNDERPAD 30X36 HEAVY ABSORB (UNDERPADS AND DIAPERS) ×2 IMPLANT
YANKAUER SUCT BULB TIP NO VENT (SUCTIONS) ×1 IMPLANT

## 2024-02-10 NOTE — Anesthesia Preprocedure Evaluation (Signed)
"                                    Anesthesia Evaluation  Patient identified by MRN, date of birth, ID band Patient awake    Reviewed: Allergy & Precautions, NPO status , Patient's Chart, lab work & pertinent test results  History of Anesthesia Complications (+) PONV and history of anesthetic complications  Airway Mallampati: II  TM Distance: >3 FB Neck ROM: Full    Dental  (+) Teeth Intact, Dental Advisory Given   Pulmonary asthma , former smoker   Pulmonary exam normal breath sounds clear to auscultation       Cardiovascular hypertension, Normal cardiovascular exam Rhythm:Regular Rate:Normal     Neuro/Psych  PSYCHIATRIC DISORDERS Anxiety     negative neurological ROS     GI/Hepatic negative GI ROS, Neg liver ROS,,,  Endo/Other  Obesity   Renal/GU negative Renal ROS     Musculoskeletal negative musculoskeletal ROS (+)    Abdominal   Peds  Hematology negative hematology ROS (+)   Anesthesia Other Findings Day of surgery medications reviewed with the patient.  Reproductive/Obstetrics                              Anesthesia Physical Anesthesia Plan  ASA: 2  Anesthesia Plan: General   Post-op Pain Management: Tylenol  PO (pre-op)*, Celebrex PO (pre-op)* and Gabapentin PO (pre-op)*   Induction: Intravenous  PONV Risk Score and Plan: 4 or greater and Scopolamine patch - Pre-op, Midazolam, Dexamethasone  and Ondansetron   Airway Management Planned: Oral ETT  Additional Equipment:   Intra-op Plan:   Post-operative Plan: Extubation in OR  Informed Consent: I have reviewed the patients History and Physical, chart, labs and discussed the procedure including the risks, benefits and alternatives for the proposed anesthesia with the patient or authorized representative who has indicated his/her understanding and acceptance.     Dental advisory given  Plan Discussed with: CRNA  Anesthesia Plan Comments:          Anesthesia Quick Evaluation  "

## 2024-02-10 NOTE — Anesthesia Procedure Notes (Signed)
 Procedure Name: Intubation Date/Time: 02/10/2024 7:31 AM  Performed by: Emilio Rock BIRCH, CRNAPre-anesthesia Checklist: Patient identified, Emergency Drugs available, Suction available and Patient being monitored Patient Re-evaluated:Patient Re-evaluated prior to induction Oxygen Delivery Method: Circle system utilized Preoxygenation: Pre-oxygenation with 100% oxygen Induction Type: IV induction Ventilation: Mask ventilation without difficulty Laryngoscope Size: Mac and 3 Grade View: Grade II Tube type: Oral Number of attempts: 1 Airway Equipment and Method: Stylet and Oral airway Placement Confirmation: ETT inserted through vocal cords under direct vision, positive ETCO2 and breath sounds checked- equal and bilateral Tube secured with: Tape Dental Injury: Teeth and Oropharynx as per pre-operative assessment

## 2024-02-10 NOTE — Op Note (Signed)
 Operative Note   DATE OF OPERATION: 12.19.2025  LOCATION: [facility]-outpatient  SURGICAL DIVISION: Plastic Surgery  PREOPERATIVE DIAGNOSES:  1. Macromastia 2. Chronic neck and back pain   POSTOPERATIVE DIAGNOSES:  same  PROCEDURE:  Bilateral breast reduction  SURGEON: Earlis Ranks MD MBA  ASSISTANT: none  ANESTHESIA:  General.   EBL: 60 ml  COMPLICATIONS: None immediate.   INDICATIONS FOR PROCEDURE:  The patient, Stephanie Woods, is a 33 y.o. female born on Apr 09, 1990, is here for treatment chronic neck and back pain in setting of macromastia that has failed conservative measures.   FINDINGS: Right reduction 984 g Left reduction 902 g  DESCRIPTION OF PROCEDURE:  The patient was marked standing in the preoperative area to mark sternal notch, chest midline, anterior axillary lines, inframammary folds. The location of new nipple areolar complex was marked at level of on inframammary fold on anterior surface breast by palpation. This was marked symmetric over bilateral breasts. With aid of Wise pattern marker, location of new nipple areolar complex and vertical limbs (7 cm) were marked by displacement of breasts along meridian. The patient was taken to the operating room. SCDs were placed and IV antibiotics were given. The patient's operative site was prepped and draped in a sterile fashion. A time out was performed and all information was confirmed to be correct.     I began on left breast. Over left breast, superior medial pedicle marked and nipple areolar complex incised with 45 diameter marker. Pedicle deepithlialized and developed to chest wall. Pedicle developed to chest wall. Breast tissue resected over lower pole. Medial and lateral flaps developed. Additional superior and lateral breast tissue excised. Breast tailor tacked closed.   I then directed attention to right breast where superior medial pedicle designed. NAC incised with 45 diameter marker. The pedicle was  deepithelialized. Pedicle developed to chest wall. Breast tissue resected over lower pole. Medial and lateral flaps developed. Additional superior and lateral breast tissue excised. Breast tailor tacked closed. Patient assessed for symmetry. Breast cavities irrigated and hemostasis obtained. Local anesthetic infiltrated throughout each breast. 15 Fr JP placed in each breast and secured with 2-0 nylon. Closure completed bilateral with interrupted and short running 3-0 vicryl used to approximate dermis along remainder inframammary fold and vertical limb. NAC inset with 3-0 vicryl in dermis. Skin closure completed with 4-0 monocryl subcuticular throughout vertical limbs and NAC. Skin closure completed with 3-0 monocryl along each IMF. Tissue adhesive applied. Dry dressing and breast binder applied.  The patient was allowed to wake from anesthesia, extubated and taken to the recovery room in satisfactory condition.   SPECIMENS: right and left breast reduction  DRAINS: 15 Fr JP in right and left breast  Earlis Ranks, MD [facility]  Office/ physician access line after hours [phone]

## 2024-02-10 NOTE — Discharge Instructions (Signed)
 No Tylenol  until 12:30pm today. No Ibuprofen  until 2:30pm today.

## 2024-02-10 NOTE — Interval H&P Note (Signed)
 History and Physical Interval Note:  02/10/2024 6:54 AM  Stephanie Woods  has presented today for surgery, with the diagnosis of macromastia chronic neck and back pain.  The various methods of treatment have been discussed with the patient and family. After consideration of risks, benefits and other options for treatment, the patient has consented to  Procedures: MAMMOPLASTY, REDUCTION (Bilateral) as a surgical intervention.  The patient's history has been reviewed, patient examined, no change in status, stable for surgery.  I have reviewed the patient's chart and labs.  Questions were answered to the patient's satisfaction.     [name]

## 2024-02-10 NOTE — Transfer of Care (Signed)
 Immediate Anesthesia Transfer of Care Note  Patient: Stephanie Woods  Procedure(s) Performed: MAMMOPLASTY, REDUCTION (Bilateral: Breast)  Patient Location: PACU  Anesthesia Type:General  Level of Consciousness: awake, alert , oriented, drowsy, and patient cooperative  Airway & Oxygen Therapy: Patient Spontanous Breathing and Patient connected to face mask oxygen  Post-op Assessment: Report given to RN and Post -op Vital signs reviewed and stable  Post vital signs: Reviewed and stable  Last Vitals:  Vitals Value Taken Time  BP    Temp    Pulse 110 02/10/24 11:08  Resp    SpO2 91 % 02/10/24 11:08  Vitals shown include unfiled device data.  Last Pain:  Vitals:   02/10/24 0637  PainSc: 0-No pain      Patients Stated Pain Goal: 4 (02/10/24 9362)  Complications: No notable events documented.

## 2024-02-12 NOTE — Anesthesia Postprocedure Evaluation (Signed)
"   Anesthesia Post Note  Patient: Stephanie Woods  Procedure(s) Performed: MAMMOPLASTY, REDUCTION (Bilateral: Breast)     Patient location during evaluation: PACU Anesthesia Type: General Level of consciousness: awake and alert Pain management: pain level controlled Vital Signs Assessment: post-procedure vital signs reviewed and stable Respiratory status: spontaneous breathing, nonlabored ventilation and respiratory function stable Cardiovascular status: blood pressure returned to baseline and stable Postop Assessment: no apparent nausea or vomiting Anesthetic complications: no   No notable events documented.  Last Vitals:  Vitals:   02/10/24 1245 02/10/24 1255  BP:    Pulse: (!) 105   Resp: 15   Temp:  36.4 C  SpO2: 96% 96%    Last Pain:  Vitals:   02/10/24 1256  PainSc: 6                  Garnette FORBES Skillern      "

## 2024-02-13 ENCOUNTER — Encounter (HOSPITAL_BASED_OUTPATIENT_CLINIC_OR_DEPARTMENT_OTHER): Payer: Self-pay | Admitting: Plastic Surgery

## 2024-02-13 LAB — SURGICAL PATHOLOGY
